# Patient Record
Sex: Male | Born: 1950 | Race: Black or African American | Hispanic: No | Marital: Married | Smoking: Former smoker
Health system: Southern US, Community
[De-identification: ages and names within clinical notes are randomized; demographics above are authoritative.]

## PROBLEM LIST (undated history)

## (undated) DIAGNOSIS — I2510 Atherosclerotic heart disease of native coronary artery without angina pectoris: Secondary | ICD-10-CM

## (undated) DIAGNOSIS — IMO0001 Reserved for inherently not codable concepts without codable children: Secondary | ICD-10-CM

## (undated) DIAGNOSIS — R0602 Shortness of breath: Secondary | ICD-10-CM

## (undated) DIAGNOSIS — E119 Type 2 diabetes mellitus without complications: Secondary | ICD-10-CM

## (undated) DIAGNOSIS — I10 Essential (primary) hypertension: Secondary | ICD-10-CM

## (undated) DIAGNOSIS — Z794 Long term (current) use of insulin: Secondary | ICD-10-CM

## (undated) HISTORY — DX: Atherosclerotic heart disease of native coronary artery without angina pectoris: I25.10

## (undated) HISTORY — DX: Shortness of breath: R06.02

## (undated) HISTORY — DX: Essential (primary) hypertension: I10

## (undated) HISTORY — DX: Type 2 diabetes mellitus without complications: E11.9

## (undated) HISTORY — DX: Long term (current) use of insulin: Z79.4

## (undated) HISTORY — DX: Reserved for inherently not codable concepts without codable children: IMO0001

---

## 2000-06-27 ENCOUNTER — Emergency Department (HOSPITAL_COMMUNITY): Admission: EM | Admit: 2000-06-27 | Discharge: 2000-06-27 | Payer: Self-pay | Admitting: Emergency Medicine

## 2005-01-29 ENCOUNTER — Emergency Department (HOSPITAL_COMMUNITY): Admission: EM | Admit: 2005-01-29 | Discharge: 2005-01-29 | Payer: Self-pay | Admitting: Emergency Medicine

## 2010-08-15 ENCOUNTER — Observation Stay (HOSPITAL_COMMUNITY)
Admission: EM | Admit: 2010-08-15 | Discharge: 2010-08-15 | Disposition: A | Discharge status: Home / Self Care | Payer: Medicare Other | Attending: Emergency Medicine | Admitting: Emergency Medicine

## 2010-08-15 ENCOUNTER — Emergency Department (HOSPITAL_COMMUNITY): Payer: Medicare Other

## 2010-08-15 ENCOUNTER — Observation Stay (HOSPITAL_COMMUNITY): Payer: Medicare Other

## 2010-08-15 DIAGNOSIS — R0602 Shortness of breath: Secondary | ICD-10-CM | POA: Insufficient documentation

## 2010-08-15 DIAGNOSIS — E119 Type 2 diabetes mellitus without complications: Secondary | ICD-10-CM | POA: Insufficient documentation

## 2010-08-15 DIAGNOSIS — Z794 Long term (current) use of insulin: Secondary | ICD-10-CM | POA: Insufficient documentation

## 2010-08-15 DIAGNOSIS — I10 Essential (primary) hypertension: Secondary | ICD-10-CM | POA: Insufficient documentation

## 2010-08-15 DIAGNOSIS — R079 Chest pain, unspecified: Principal | ICD-10-CM | POA: Insufficient documentation

## 2010-08-15 LAB — CBC
Hemoglobin: 12.2 g/dL — ABNORMAL LOW (ref 13.0–17.0)
MCH: 24.7 pg — ABNORMAL LOW (ref 26.0–34.0)
MCHC: 32.2 g/dL (ref 30.0–36.0)
RDW: 15 % (ref 11.5–15.5)

## 2010-08-15 LAB — BASIC METABOLIC PANEL
CO2: 23 mEq/L (ref 19–32)
Chloride: 102 mEq/L (ref 96–112)
Creatinine, Ser: 1.06 mg/dL (ref 0.4–1.5)
GFR calc Af Amer: >60 mL/min (ref >60)
Glucose, Bld: 205 mg/dL — ABNORMAL HIGH (ref 70–99)

## 2010-08-15 LAB — CK TOTAL AND CKMB (NOT AT ARMC)
CK, MB: 3.5 ng/mL (ref 0.3–4.0)
Relative Index: 1.3 (ref 0.0–2.5)

## 2010-08-15 LAB — POCT CARDIAC MARKERS: Myoglobin, poc: 56.8 ng/mL (ref 12–200)

## 2010-08-15 MED ORDER — IOHEXOL 350 MG/ML SOLN
80.0000 mL | Freq: Once | INTRAVENOUS | Status: AC | PRN
  Administered 2010-08-15: 80 mL via INTRAVENOUS

## 2010-08-23 ENCOUNTER — Encounter: Payer: Self-pay | Admitting: Cardiovascular Disease

## 2010-08-24 ENCOUNTER — Ambulatory Visit (INDEPENDENT_AMBULATORY_CARE_PROVIDER_SITE_OTHER): Payer: Medicare Other | Admitting: Cardiovascular Disease

## 2010-08-24 ENCOUNTER — Encounter: Payer: Self-pay | Admitting: *Deleted

## 2010-08-24 ENCOUNTER — Encounter: Payer: Self-pay | Admitting: Cardiovascular Disease

## 2010-08-24 VITALS — BP 140/80 | HR 75 | Ht 69.0 in | Wt 207.0 lb

## 2010-08-24 DIAGNOSIS — E119 Type 2 diabetes mellitus without complications: Secondary | ICD-10-CM | POA: Insufficient documentation

## 2010-08-24 DIAGNOSIS — E785 Hyperlipidemia, unspecified: Secondary | ICD-10-CM | POA: Insufficient documentation

## 2010-08-24 DIAGNOSIS — I208 Other forms of angina pectoris: Secondary | ICD-10-CM

## 2010-08-24 DIAGNOSIS — I10 Essential (primary) hypertension: Secondary | ICD-10-CM | POA: Insufficient documentation

## 2010-08-24 DIAGNOSIS — R079 Chest pain, unspecified: Secondary | ICD-10-CM | POA: Insufficient documentation

## 2010-08-24 DIAGNOSIS — I209 Angina pectoris, unspecified: Secondary | ICD-10-CM

## 2010-08-24 NOTE — Patient Instructions (Signed)
Your physician recommends that you continue on your current medications as directed. Please refer to the Current Medication list given to you today.  Your physician has requested that you have a cardiac catheterization. Cardiac catheterization is used to diagnose and/or treat various heart conditions. Doctors may recommend this procedure for a number of different reasons. The most common reason is to evaluate chest pain. Chest pain can be a symptom of coronary artery disease (CAD), and cardiac catheterization can show whether plaque is narrowing or blocking your heart's arteries. This procedure is also used to evaluate the valves, as well as measure the blood flow and oxygen levels in different parts of your heart. For further information please visit [email or web address] Please follow instruction sheet, as given. 08/25/10 WITH DR Excell Seltzer  Your physician recommends that you schedule a follow-up appointment in: AFTER CATH

## 2010-08-24 NOTE — Progress Notes (Signed)
60 yo seen in [place] 5/21 for SSCP.  Enzymes negative  ECG reviewed and had mild T wave changes inferiorly.  Had cardiac CT which was read by Dr Reche Dixon.  I reviewed the study on the [employer] 3[employer] portal.  High likelyhood of > 70% calcific lesion in mid LAD.  Patient has continued to have exertional SSCP since D/C.  Previously followed at [facility] but just established at [facility].  CRF;s include DM on insulin, HTN and elevated lipids.  Has not had heart cath before.  No previously documented CAD.  Long discussion with Mr Banet and his wife. With abnormal CT, DM and SSCP needs heart cath.  Risks including stroke discussed and willing to proceed.  Will hold metformin and give half his normal long acting insulin tonight.  Will need stat BS and PT/PTT on arrival to section C tomorrow.  Have set up for Dr Excell Seltzer to cath in inpatient lab tomorrow am.  ROS: Denies fever, malais, weight loss, blurry vision, decreased visual acuity, cough, sputum, SOB, hemoptysis, pleuritic pain, palpitaitons, heartburn, abdominal pain, melena, lower extremity edema, claudication, or rash.   General: Affect appropriate Healthy:  appears stated age HEENT: normal Neck supple with no adenopathy JVP normal no bruits no thyromegaly Lungs clear with no wheezing and good diaphragmatic motion Heart:  S1/S2 no murmur,rub, gallop or click PMI normal Abdomen: benighn, BS positve, no tenderness, no AAA no bruit.  No HSM or HJR Distal pulses intact with no bruits No edema Neuro non-focal Skin warm and dry No muscular weakness  Medications Current Outpatient Prescriptions  Medication Sig Dispense Refill  . amitriptyline (ELAVIL) 25 MG tablet Take 25 mg by mouth at bedtime.        Marland Kitchen amLODipine (NORVASC) 10 MG tablet Take 10 mg by mouth daily.        . cyanocobalamin 500 MCG tablet Take 500 mcg by mouth daily.        Marland Kitchen dicyclomine (BENTYL) 10 MG capsule Take 10 mg by mouth as directed.        . hydroxypropyl methylcellulose (ISOPTO  TEARS) 2.5 % ophthalmic solution 1 drop.        . insulin aspart (NOVOLOG) 100 UNIT/ML injection Inject into the skin as directed.        Marland Kitchen lisinopril (PRINIVIL,ZESTRIL) 40 MG tablet Take 40 mg by mouth daily.        . metFORMIN (GLUMETZA) 1000 MG (MOD) 24 hr tablet Take 1,000 mg by mouth 2 (two) times daily with a meal.       . omeprazole (PRILOSEC) 20 MG capsule Take 20 mg by mouth daily.        . pravastatin (PRAVACHOL) 20 MG tablet Take 20 mg by mouth daily.          Allergies Review of patient's allergies indicates no known allergies.  Family History: No family history on file. Family history of DM on mothers side  Social History: History   Social History  . Marital Status: Legally Separated    Spouse Name: N/A    Number of Children: N/A  . Years of Education: N/A   Occupational History  . Not on file.   Social History Main Topics  . Smoking status: Former Smoker    Quit date: 03/28/1983  . Smokeless tobacco: Not on file  . Alcohol Use: No  . Drug Use: No  . Sexually Active: Not on file   Other Topics Concern  . Not on file   Social  History Narrative  . No narrative on file  Retiered.  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  Denies smoking and ETOH Sedentary.    Electrocardiogram:  5/21 NSR inferior T wave changes  Assessment and Plan

## 2010-08-24 NOTE — Assessment & Plan Note (Signed)
Well controlled.  Continue current medications and low sodium Dash type diet.    

## 2010-08-24 NOTE — Assessment & Plan Note (Signed)
Hold metformin for cath and take half LA insulin night before

## 2010-08-24 NOTE — Assessment & Plan Note (Signed)
Cholesterol is at goal.  Continue current dose of statin and diet Rx.  No myalgias or side effects.  F/U  LFT's in 6 months. No results found for this basename: LDLCALC             

## 2010-08-24 NOTE — Assessment & Plan Note (Signed)
Likely angina.  Reviewed cardiac CT.  Cath in am with Dr Excell Seltzer

## 2010-08-25 ENCOUNTER — Ambulatory Visit (HOSPITAL_COMMUNITY)
Admission: RE | Admit: 2010-08-25 | Discharge: 2010-08-25 | Disposition: A | Discharge status: Home / Self Care | Payer: Medicare Other | Source: Ambulatory Visit | Attending: Cardiovascular Disease | Admitting: Cardiovascular Disease

## 2010-08-25 DIAGNOSIS — I10 Essential (primary) hypertension: Secondary | ICD-10-CM | POA: Insufficient documentation

## 2010-08-25 DIAGNOSIS — I2510 Atherosclerotic heart disease of native coronary artery without angina pectoris: Secondary | ICD-10-CM | POA: Insufficient documentation

## 2010-08-25 DIAGNOSIS — R079 Chest pain, unspecified: Secondary | ICD-10-CM | POA: Insufficient documentation

## 2010-08-25 DIAGNOSIS — E119 Type 2 diabetes mellitus without complications: Secondary | ICD-10-CM | POA: Insufficient documentation

## 2010-08-25 LAB — GLUCOSE, CAPILLARY: Glucose-Capillary: 149 mg/dL — ABNORMAL HIGH (ref 70–99)

## 2010-08-25 LAB — BASIC METABOLIC PANEL
CO2: 29 mEq/L (ref 19–32)
Chloride: 103 mEq/L (ref 96–112)
GFR calc Af Amer: >60 mL/min (ref >60)
Glucose, Bld: 163 mg/dL — ABNORMAL HIGH (ref 70–99)

## 2010-08-25 LAB — CBC
Platelets: 263 10*3/uL (ref 150–400)
RBC: 4.74 MIL/uL (ref 4.22–5.81)
RDW: 14.8 % (ref 11.5–15.5)
WBC: 5.9 10*3/uL (ref 4.0–10.5)

## 2010-08-25 LAB — APTT: aPTT: 29 seconds (ref 24–37)

## 2010-08-25 LAB — PROTIME-INR: INR: 1.04 (ref 0.00–1.49)

## 2010-08-25 LAB — POCT ACTIVATED CLOTTING TIME: Activated Clotting Time: 228 seconds

## 2010-09-19 ENCOUNTER — Telehealth: Payer: Self-pay | Admitting: Cardiovascular Disease

## 2010-09-19 NOTE — Telephone Encounter (Signed)
Pt dropped off [facility] papers to be completed sent to Victorio Palm 09/19/10/km

## 2010-09-22 ENCOUNTER — Telehealth: Payer: Self-pay | Admitting: Cardiovascular Disease

## 2010-09-22 NOTE — Telephone Encounter (Signed)
After receiving it from [facility]. I put the patients ROI and Disability Form into the courier to be forwarded to [facility] for completion.

## 2010-09-29 ENCOUNTER — Telehealth: Payer: Self-pay | Admitting: Cardiovascular Disease

## 2010-09-29 NOTE — Telephone Encounter (Signed)
[facility] Form faxed to [phone] Per Victorio Palm 09/29/10/km

## 2010-09-29 NOTE — Telephone Encounter (Signed)
Pt checking on the status of paperwork he brought in

## 2010-09-29 NOTE — Telephone Encounter (Signed)
Spoke with pt, he wants a copy of the form sent to the [place] mailed to him. Medical records locating the form for me to copy for the pt [name]

## 2010-09-30 ENCOUNTER — Telehealth: Payer: Self-pay | Admitting: Cardiovascular Disease

## 2010-09-30 NOTE — Telephone Encounter (Signed)
Pt walked into the office and questions answered [name]

## 2010-09-30 NOTE — Telephone Encounter (Signed)
[facility]  [facility] .

## 2010-09-30 NOTE — Telephone Encounter (Signed)
Per pt calling, receive his paperwork - has additional question.

## 2010-10-13 NOTE — Cardiovascular Report (Signed)
[name], [name]                ACCOUNT NO.:  [id number]  MEDICAL RECORD NO.:  [id number]           PATIENT TYPE:  O  LOCATION:  6522                         FACILITY:  [facility]  PHYSICIAN:  Veverly Fells. Excell Seltzer, MD  DATE OF BIRTH:  [birth year]  DATE OF PROCEDURE:  08/25/2010 DATE OF DISCHARGE:  08/25/2010                           CARDIAC CATHETERIZATION   PROCEDURES: 1. Left heart catheterization. 2. Selective coronary angiography. 3. Left ventricular angiography. 4. Pressure wire analysis of the left anterior descending.  SURGEON:  Veverly Fells. Excell Seltzer, MD  [name] INDICATIONS:  [name] is a 60 year old diabetic, hypertensive gentleman with chest pain.  He was evaluated in the emergency department and had an abnormal CT coronary angiogram demonstrating 70% mid LAD stenosis with calcification.  The patient was referred for cardiac catheterization in the setting of angina and an abnormal CT coronary angiogram.  DESCRIPTION OF PROCEDURE:  Risks and indications of procedure were reviewed with the patient.  Informed consent was obtained.  The right wrist was prepped, draped, and anesthetized with 1% lidocaine using modified Seldinger technique.  A 5-French sheath was placed in the right radial artery, 4000 units of fractionated heparin was administered intravenously, 3 mg of verapamil was given through the sheath.  Standard Judkins catheters were used for coronary angiography and left ventriculography.  Following the diagnostic procedure, I elected to perform pressure wire analysis of the mid-LAD lesion.  PROCEDURAL FINDINGS:  Aortic pressure 139/75 with a mean of 102.  Left ventricular pressure 141/29.  Left ventriculography shows vigorous LV function.  The left ventricular ejection fraction is 65-70%.  Left mainstem:  Angiographically normal.  The left main divides into the LAD and left circumflex.  LAD:  The LAD is of large caliber.  It reaches the left  ventricular apex.  The LAD gives off a large first diagonal branch.  At the origin of first diagonal branch in the mid LAD is a 50-60% stenosis.  This is the lesion that was interrogated with pressure wire analysis.  The pressure wire demonstrated an FFR equal to 0.85 at peak hyperemia with intravenous adenosine.  Left circumflex:  The left circumflex is widely patent.  It is a large- caliber vessel supplying a tiny first OM, moderate second OM, and a large third OM.  There is no significant obstructive disease through the left circumflex distribution.  Right coronary artery:  The RCA is a large, dominant vessel.  It supplies a PDA and two posterolateral branches.  There is no significant obstructive disease throughout the distribution of the right coronary artery.  PRESSURE WIRE DATA:  The radial sheath was upsized to a 6-French over an 0.035 wire.  A 6-French XB LAD guide catheter was used for the procedure.  An additional 4000 units of unfractionated heparin was administered and once the ACT was greater than 200, the pressure wire was normalized at the guide tip and then advanced beyond the lesion in the mid-LAD.  With peak hyperemia after 2-3 minutes of adenosine, the FFR was equal to 0.85 and two recordings were performed.  This is below the threshold for significant ischemia and the wire  was pulled out. Final angiography demonstrated stable appearance of the lesion.  The patient will be treated medically.  A TR band was used for radial hemostasis.  FINAL CONCLUSIONS: 1. Nonobstructive coronary artery disease as outlined above with the     most significant lesion being a 50% stenosis in the mid-left     anterior descending. 2. Negative pressure wire analysis of the left anterior descending     with an FFR of 0.85. 3. Normal left ventricular systolic function. 4. Elevated left ventricular end-diastolic pressure consistent with     diastolic dysfunction.  RECOMMENDATIONS:   Aggressive medical therapy.     Veverly Fells. Excell Seltzer, MD     MDC/MEDQ  D:  08/25/2010  T:  08/25/2010  Job:  [id number]  cc:   Noralyn Pick. Eden Emms, MD, [facility]  Electronically Signed by Tonny Bollman MD on 10/13/2010 12:08:05 AM

## 2010-12-12 ENCOUNTER — Telehealth: Payer: Self-pay | Admitting: Cardiovascular Disease

## 2010-12-12 NOTE — Telephone Encounter (Signed)
This pt was seen in the office by Dr Eden Emms and then had a cardiac cath performed by Dr Excell Seltzer in May.  This pt has not had any follow-up appointments. I left a message for the pt to call the office back to discuss need for clearance.

## 2010-12-12 NOTE — Telephone Encounter (Signed)
Pt returning your call msh °

## 2010-12-12 NOTE — Telephone Encounter (Signed)
I spoke with the pt and he was seen in [facility] by [facility].  The pt needs an implant for erectile dysfuction.  The pt's procedure is  not scheduled at this time. The pt has not been seen in our office since his procedure in May.  I made the pt aware that he will need an appointment to discuss surgical clearance.

## 2010-12-12 NOTE — Telephone Encounter (Signed)
Please call about surgical clearance

## 2010-12-13 NOTE — Telephone Encounter (Signed)
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  I scheduled him to see Dr Eden Emms on 12/29/10 to discuss surgical clearance.  The pt will contact Urology office to have records faxed for his up coming appointment. Fax number given to patient.

## 2010-12-29 ENCOUNTER — Encounter: Payer: Self-pay | Admitting: Cardiovascular Disease

## 2010-12-29 ENCOUNTER — Telehealth: Payer: Self-pay | Admitting: Cardiovascular Disease

## 2010-12-29 ENCOUNTER — Ambulatory Visit (INDEPENDENT_AMBULATORY_CARE_PROVIDER_SITE_OTHER): Payer: Medicare Other | Admitting: Cardiovascular Disease

## 2010-12-29 DIAGNOSIS — E119 Type 2 diabetes mellitus without complications: Secondary | ICD-10-CM

## 2010-12-29 DIAGNOSIS — I2510 Atherosclerotic heart disease of native coronary artery without angina pectoris: Secondary | ICD-10-CM

## 2010-12-29 DIAGNOSIS — Z01810 Encounter for preprocedural cardiovascular examination: Secondary | ICD-10-CM

## 2010-12-29 DIAGNOSIS — I10 Essential (primary) hypertension: Secondary | ICD-10-CM

## 2010-12-29 MED ORDER — ISOSORBIDE MONONITRATE 30 MG PO TB24: 15.0000 mg | ORAL_TABLET | Freq: Every day | ORAL | Status: DC

## 2010-12-29 MED ORDER — METOPROLOL SUCCINATE ER 50 MG PO TB24: 50.0000 mg | ORAL_TABLET | Freq: Every day | ORAL | Status: DC

## 2010-12-29 NOTE — Assessment & Plan Note (Signed)
No angina.  FFR normal at cath.  Will D/C calcium blocker and start nitrates and beta blocker.  Ok to proceed with surgery for ED.  No viagra or cialis with CAD and starting nitrates.  Discussed this with patient

## 2010-12-29 NOTE — Telephone Encounter (Signed)
Dr Logan Bores is his Urologist in [place] [phone], was to call and let dr Eden Emms know

## 2010-12-29 NOTE — Progress Notes (Signed)
Addended by: Scherrie Bateman E on: 12/29/2010 10:02 AM   Modules accepted: Orders

## 2010-12-29 NOTE — Assessment & Plan Note (Signed)
Target A1c 6.5 or less.  Discussed low carb diet 

## 2010-12-29 NOTE — Assessment & Plan Note (Signed)
Well controlled.  Continue current medications and low sodium Dash type diet.    

## 2010-12-29 NOTE — Patient Instructions (Signed)
Your physician wants you to follow-up in: 3 MONTHS WITH DR Haywood Filler will receive a reminder letter in the mail two months in advance. If you don't receive a letter, please call our office to schedule the follow-up appointment. Your physician has recommended you make the following change in your medication: STOP AMLODIPINE  AND START IMDUR 15 MG 1 EVERY DAY  AND TOPROL 50 MG 1 EVERY DAY

## 2010-12-29 NOTE — Telephone Encounter (Signed)
Will forward to Dr.Nishan. 

## 2010-12-29 NOTE — Assessment & Plan Note (Signed)
Stable with no angina and good activity level.  Continue medical Rx  

## 2010-12-29 NOTE — Progress Notes (Signed)
60 yo seen in [place] 5/21 for SSCP. Enzymes negative ECG reviewed and had mild T wave changes inferiorly. Had cardiac CT which was read by Dr Reche Dixon. I reviewed the study on the [employer] 3[employer] portal. High likelyhood of > 70% calcific lesion in mid LAD. Patient has continued to have exertional SSCP since D/C. Previously followed at [facility] but just established at [facility]. CRF;s include DM on insulin, HTN and elevated lipids.  Cath by Dr Excell Seltzer in May wit 60% LAD and negative flow wire.  Medical Rx recommended.  No angina.  ECG today shows more prominent inferolateral T wave changes than 08/25/10.  Has ED with surgery pending.  Does not remember urologist name.  Failed shots and cialis. Told him not to take viagra or cialis anymore.    Low risk surgery with recent cath showing no flow limitation to LAD and no significant circ or RCA disease.  Will adjust meds and clear for surgery.  D/C amlodipine and add nitrates and beta blocker.    ROS: Denies fever, malais, weight loss, blurry vision, decreased visual acuity, cough, sputum, SOB, hemoptysis, pleuritic pain, palpitaitons, heartburn, abdominal pain, melena, lower extremity edema, claudication, or rash.  All other systems reviewed and negative  General: Affect appropriate Healthy:  appears stated age HEENT: normal Neck supple with no adenopathy JVP normal no bruits no thyromegaly Lungs clear with no wheezing and good diaphragmatic motion Heart:  S1/S2 no murmur,rub, gallop or click PMI normal Abdomen: benighn, BS positve, no tenderness, no AAA no bruit.  No HSM or HJR Distal pulses intact with no bruits No edema Neuro non-focal Skin warm and dry No muscular weakness   Current Outpatient Prescriptions  Medication Sig Dispense Refill  . amitriptyline (ELAVIL) 25 MG tablet Take 25 mg by mouth at bedtime.        Marland Kitchen amLODipine (NORVASC) 10 MG tablet Take 10 mg by mouth daily.        . cyanocobalamin 500 MCG tablet Take 500 mcg by mouth daily.         Marland Kitchen dicyclomine (BENTYL) 10 MG capsule Take 10 mg by mouth as directed.        . hydroxypropyl methylcellulose (ISOPTO TEARS) 2.5 % ophthalmic solution 1 drop.        . insulin aspart (NOVOLOG) 100 UNIT/ML injection Inject into the skin as directed.        Marland Kitchen lisinopril (PRINIVIL,ZESTRIL) 40 MG tablet Take 40 mg by mouth daily.        . metFORMIN (GLUMETZA) 1000 MG (MOD) 24 hr tablet Take 1,000 mg by mouth 2 (two) times daily with a meal.       . omeprazole (PRILOSEC) 20 MG capsule Take 20 mg by mouth daily.        . pravastatin (PRAVACHOL) 20 MG tablet Take 20 mg by mouth daily.          Allergies  Review of patient's allergies indicates no known allergies.  Electrocardiogram:  Assessment and Plan

## 2011-01-09 ENCOUNTER — Telehealth: Payer: Self-pay | Admitting: Cardiovascular Disease

## 2011-01-09 NOTE — Telephone Encounter (Signed)
Spoke with pt, clearance note faxed to dr Logan Bores at [phone]. [name]

## 2011-01-09 NOTE — Telephone Encounter (Signed)
Pt was calling about surgical clearance please call

## 2011-01-19 ENCOUNTER — Telehealth: Payer: Self-pay | Admitting: Cardiovascular Disease

## 2011-01-19 NOTE — Telephone Encounter (Signed)
Add Toprol 50mg /day

## 2011-01-19 NOTE — Telephone Encounter (Signed)
10/24--pt calling stating BP meds not working--BP per pt last 2 days=174/74, 186/80---when i called pt i had him sit down at home take some slow easy breaths and then take BP--it was 140/68--advised pt dr Eden Emms and debra not in office today, but i would let them know your BP readings and let them know you are concerned--pt agrees--nt

## 2011-01-19 NOTE — Telephone Encounter (Signed)
Pt says his BP is still high.  He says the med his is taking is not working. He wants to talk to you

## 2011-01-20 NOTE — Telephone Encounter (Addendum)
Spoke with pt, he is currently taking toprol 50 mg. Will forward for dr Eden Emms review  [name]

## 2011-01-22 NOTE — Telephone Encounter (Signed)
If BP running high continue Toprol 50 and Imdur 30mg   D/C Lisinopril and start Hyzaar 100/25.  Check BMET in 4 weeks if change made

## 2011-01-23 NOTE — Telephone Encounter (Signed)
Spoke with pt, he states he is already taking a fluid pill. Explained to pt we can not adjust his medicine without having a current med list. He will call back when he is at home and can tell me his medicine [name]

## 2011-02-02 ENCOUNTER — Telehealth: Payer: Self-pay | Admitting: Cardiovascular Disease

## 2011-02-02 NOTE — Telephone Encounter (Signed)
Pt returning your call about his meds

## 2011-02-02 NOTE — Telephone Encounter (Signed)
Spoke with pt wife, he is currently in [place] having an outpt procedure. Explained we need to know his meds to adjust them for his bp. She will have him call me [name]

## 2011-02-03 NOTE — Telephone Encounter (Signed)
Spoke with pt, had been trying to get the correct med list from the pt so adjustments could be made in regards to his bp. He states his bp has been taken care of [name]

## 2012-06-22 IMAGING — CT CT HEART MORP W/ CTA COR W/ SCORE W/ CA W/CM &/OR W/O CM
1 of 7 series · 11 of 20 positions shown, 14 images · IV contrast (CONTRAST)
Comparison: None.

INDICATION: Left-sided chest pain.  Shortness of breath.

CT ANGIOGRAPHY OF THE HEART, CORONARY ARTERY, STRUCTURE, AND
MORPHOLOGY 08/15/2010:
CONTRAST:  80 ml Omnipaque 350 IV.
TECHNIQUE: CT angiography of the coronary vessels was performed on
a 256 channel system using prospective ECG gating.  A scout and
noncontrast exam (for calcium scoring) were performed.  Circulation
time was measured using a test bolus.  Coronary CTA was performed
with sub mm slice collimation during portions of the cardiac cycle
(73%, 78%, and 83%) during injection of iodinated contrast.
Imaging post processing was performed on an independent workstation
creating multiplanar and 3-D images, and quantitative analysis of
the heart and coronary arteries.  Note that this exam targets the
heart and the chest was not imaged in its entirety.
PREMEDICATION:
Lopressor 50 mg, P.O.
Lopressor 0 mg, IV
Nitroglycerin 0.4 mcg, sublingual.

[Series 7: w/ edge cor., 78.0% · axial · 0.49mm/px · z∈[+676,+792]mm · 11 of 311 slices shown, 14 images]
[im 26/311  vessel]
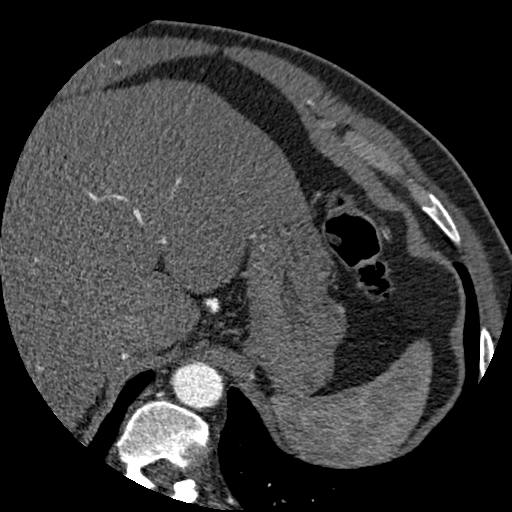
[im 26/311  lung]
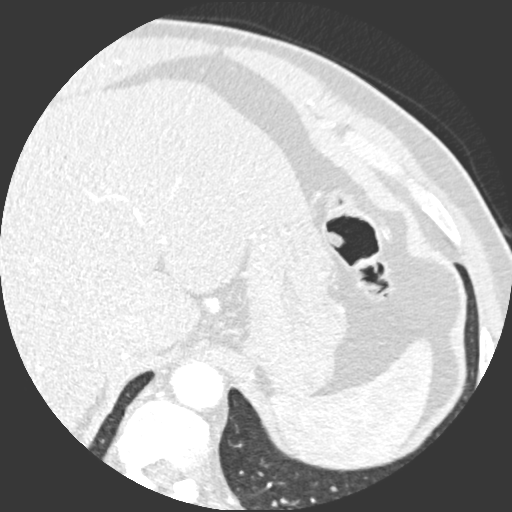
[im 52/311  vessel]
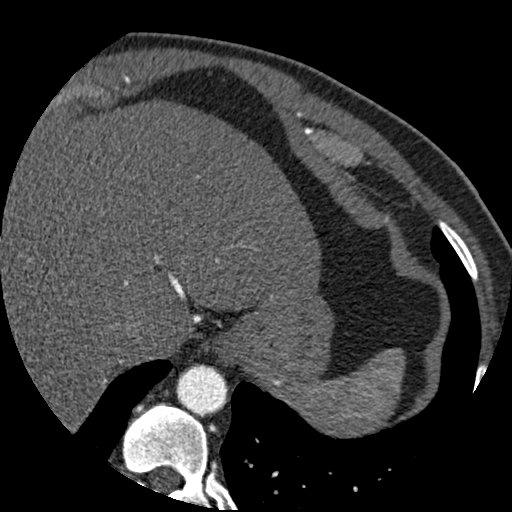
[im 78/311  vessel]
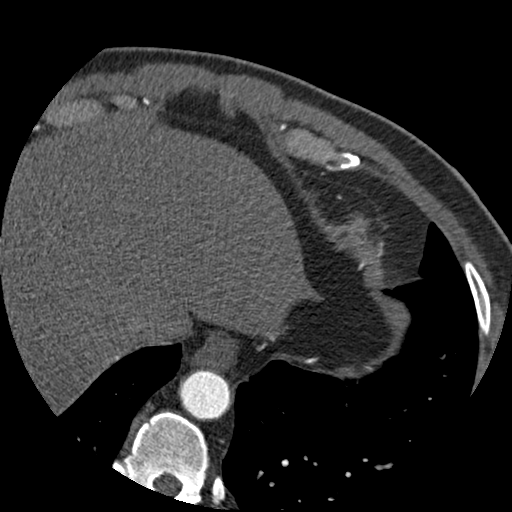
[im 104/311  vessel]
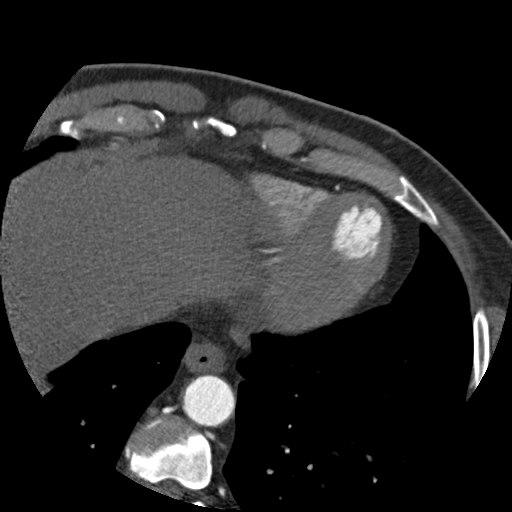
[im 130/311  vessel]
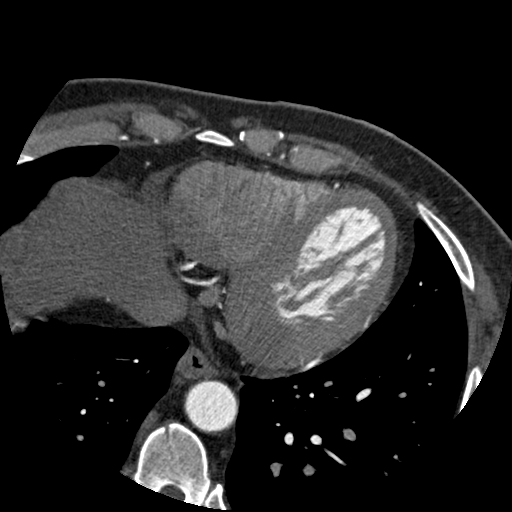
[im 130/311  lung]
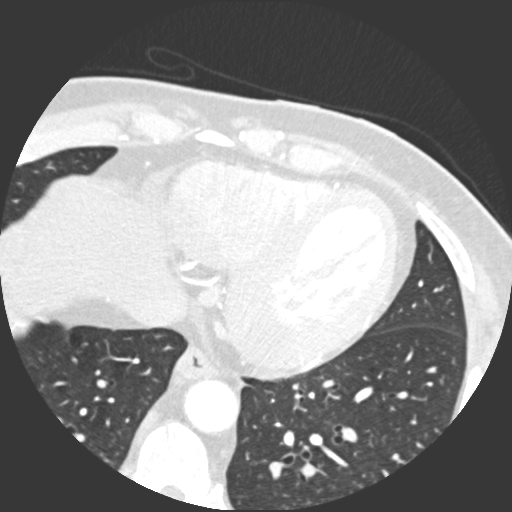
[im 156/311  vessel]
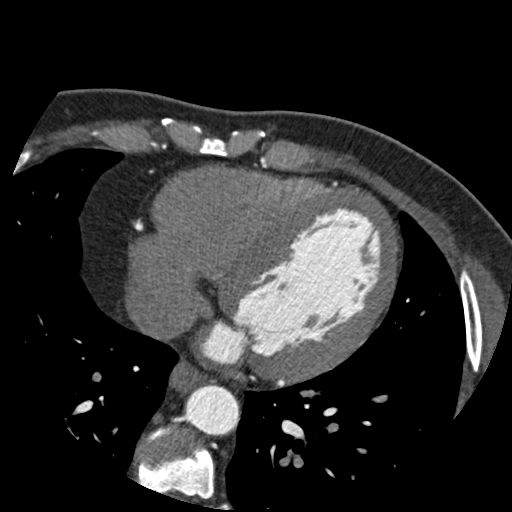
[im 181/311  vessel]
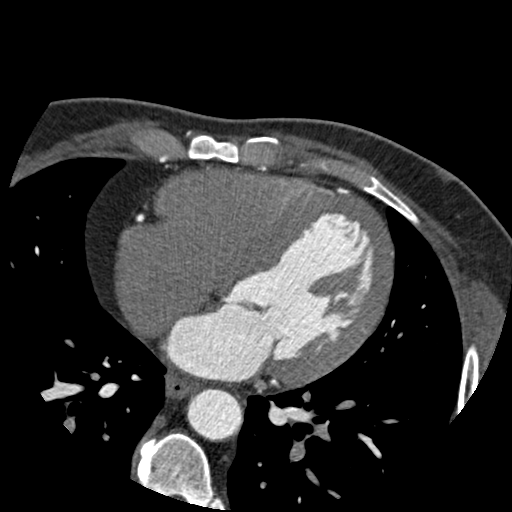
[im 207/311  vessel]
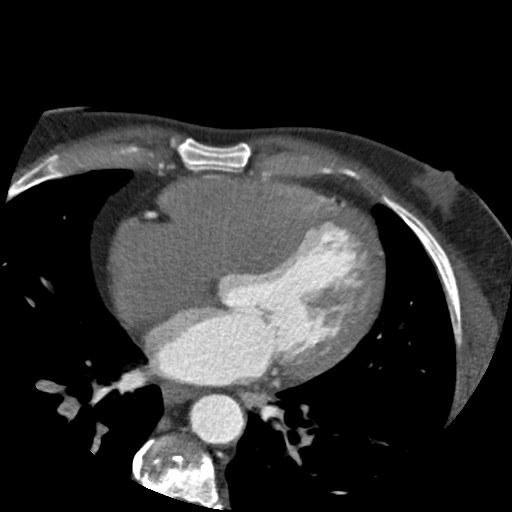
[im 233/311  vessel]
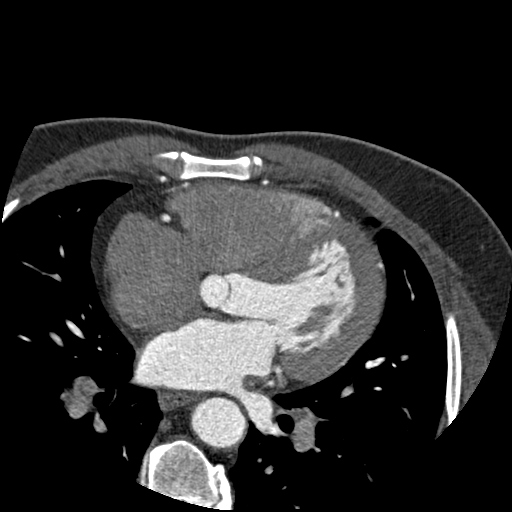
[im 233/311  lung]
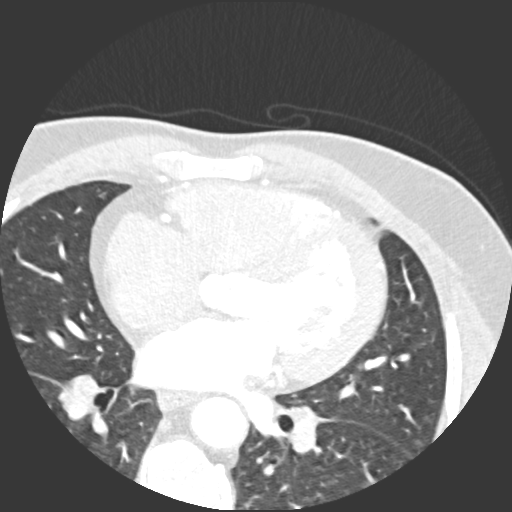
[im 259/311  vessel]
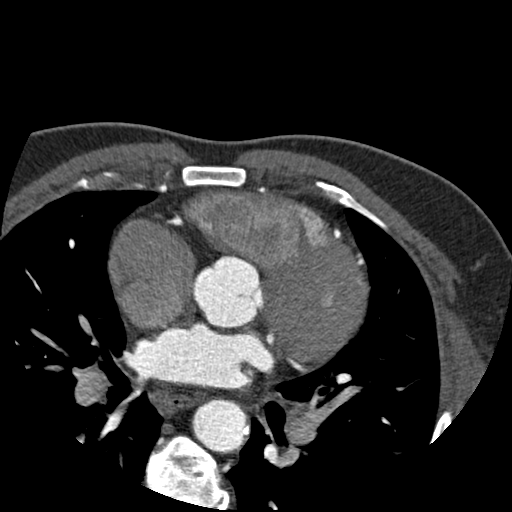
[im 285/311  vessel]
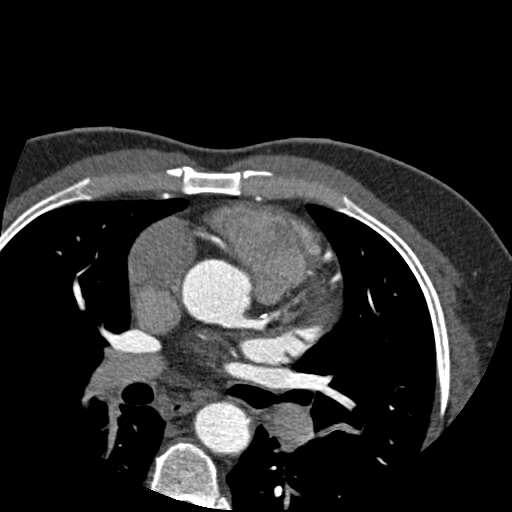

[11 of 20 positions shown; findings below may reference images not displayed]

FINDINGS: Technical quality:  Excellent.
Heart rate:  55

CORONARY ARTERIES:
Left main coronary artery:  Nonobstructive mixed calcified and
noncalcified plaque at the origin of the left main.

Left anterior descending:  Diminutive first and third diagonals
with large second diagonal.  Nonobstructive mixed calcified and
noncalcified (predominately calcified) plaque just distal to the
LAD origin.  Nonobstructive noncalcified plaque in the proximal
LAD.  Large calcified plaque in the mid LAD, just distal to the
second diagonal origin, which it is possibly obstructive (near 50%
diameter).  A non obstructive mixed calcified and noncalcified
plaque in the mid LAD, just distal to the large plaque.

Left circumflex:  2 large obtuse marginal branches.  Nonobstructing
minimal calcified plaque in the circumflex just distal to the first
diagonal origin.

Right coronary artery:  Minimal nonobstructive calcified plaque in
the proximal RCA.

Posterior descending artery:  Normal.

Dominance:  Right.

CORONARY CALCIUM:

Total Agatston Score:  192
[HOSPITAL] percentile:  71st

CARDIAC MEASUREMENTS:
Interventricular septum (6 - 12 mm):
LV posterior wall (6 - 12 mm):
LV diameter in diastole (35 - 52 mm):

AORTA AND PULMONARY MEASUREMENTS:
Aortic root (21 - 40 mm):
            22 mm  at the annulus
            35 mm  at the sinuses of Valsalva
            26 mm  at the sinotubular junction
Ascending aorta (<  40 mm):  32 mm
Descending aorta (<  40 mm):  28 mm
Main pulmonary artery:  (<  30 mm):  28 mm

EXTRACARDIAC FINDINGS:
No significant lymphadenopathy within the visualized mediastinum.
Mild to moderate mixed calcified and noncalcified plaque within the
visualized thoracic aorta, without evidence of aneurysm.  Calcified
granulomata within the visualized spleen.  Visualized upper abdomen
otherwise unremarkable.  Approximate 5 mm peripheral nodule in the
right lower lobe posterolaterally (series 3, image 8).  No other
pulmonary nodules.  Lungs otherwise clear.  No pleural effusions.
IMPRESSION: 1.  Moderate coronary artery disease.  The patient's total coronary
artery calcium score is 192, which is 71st percentile for patient's
matched age and gender.
2.  Possibly obstructive large calcified plaque in the mid left
anterior descending coronary artery, just distal to the origin of
the large second diagonal branch.  Please see above for details of
nonobstructive plaques elsewhere.
3.  Right coronary artery dominance.
4.  Approximate 5 mm peripheral nodule in the right lower lobe
posterolaterally.  Please see below for followup recommendations.

Report was called to [REDACTED] mid level, Kris, at [phone] at 7898
hours on 08/15/2010.

If the patient is at high risk for bronchogenic carcinoma, follow-
up chest CT at 6-12 months is recommended.  If the patient is at
low risk for bronchogenic carcinoma, follow-up chest CT at 12
months is recommended.  This recommendation follows the consensus
statement: Guidelines for Management of Small Pulmonary Nodules
Detected on CT Scans: A Statement from the [HOSPITAL] as
[URL]

## 2012-07-19 ENCOUNTER — Encounter: Payer: Self-pay | Admitting: *Deleted

## 2012-07-19 ENCOUNTER — Encounter: Payer: Self-pay | Admitting: Cardiovascular Disease

## 2012-07-22 ENCOUNTER — Encounter: Payer: Self-pay | Admitting: Cardiovascular Disease

## 2012-07-22 ENCOUNTER — Ambulatory Visit (INDEPENDENT_AMBULATORY_CARE_PROVIDER_SITE_OTHER): Payer: Medicare Other | Admitting: Cardiovascular Disease

## 2012-07-22 VITALS — BP 156/72 | HR 76 | Ht 69.0 in | Wt 219.4 lb

## 2012-07-22 DIAGNOSIS — E119 Type 2 diabetes mellitus without complications: Secondary | ICD-10-CM

## 2012-07-22 DIAGNOSIS — I10 Essential (primary) hypertension: Secondary | ICD-10-CM

## 2012-07-22 DIAGNOSIS — I2510 Atherosclerotic heart disease of native coronary artery without angina pectoris: Secondary | ICD-10-CM

## 2012-07-22 DIAGNOSIS — E785 Hyperlipidemia, unspecified: Secondary | ICD-10-CM

## 2012-07-22 NOTE — Assessment & Plan Note (Signed)
Well controlled.  Continue current medications and low sodium Dash type diet.    

## 2012-07-22 NOTE — Progress Notes (Signed)
Patient ID: [name], male   DOB: September 21, 1950, 62 y.o.   MRN: [id number] 62 yo seen in [place] 08/15/10 for SSCP. Enzymes negative ECG reviewed and had mild T wave changes inferiorly. Had cardiac CT which was read by Dr Reche Dixon. I reviewed the study on the [employer] 3[employer] portal. High likelyhood of > 70% calcific lesion in mid LAD. Patient has continued to have exertional SSCP since D/C. Previously followed at [facility] but just established at [facility]. CRF;s include DM on insulin, HTN and elevated lipids. Cath by Dr Excell Seltzer in May 2012  wit 60% LAD and negative flow wire. Medical Rx recommended.  Since last being seen no chest pain.  Seeing Dr Irene Limbo off [facility] Compliant with meds Needs to see eye doctor  ROS: Denies fever, malais, weight loss, blurry vision, decreased visual acuity, cough, sputum, SOB, hemoptysis, pleuritic pain, palpitaitons, heartburn, abdominal pain, melena, lower extremity edema, claudication, or rash.  All other systems reviewed and negative  General: Affect appropriate Obese black male HEENT: normal Neck supple with no adenopathy JVP normal no bruits no thyromegaly Lungs clear with no wheezing and good diaphragmatic motion Heart:  S1/S2 no murmur, no rub, gallop or click PMI normal Abdomen: benighn, BS positve, no tenderness, no AAA Ventral hernia no bruit.  No HSM or HJR Distal pulses intact with no bruits No edema Neuro non-focal Skin warm and dry No muscular weakness   Current Outpatient Prescriptions  Medication Sig Dispense Refill  . amitriptyline (ELAVIL) 25 MG tablet Take 25 mg by mouth at bedtime.        . cloNIDine (CATAPRES) 0.2 MG tablet Take 0.2 mg by mouth 3 (three) times daily.      . cyanocobalamin 500 MCG tablet Take 500 mcg by mouth daily.        Marland Kitchen dicyclomine (BENTYL) 10 MG capsule Take 10 mg by mouth as directed.        . furosemide (LASIX) 40 MG tablet Take 40 mg by mouth daily.      Marland Kitchen gabapentin (NEURONTIN) 300 MG capsule Take 300 mg by mouth 3 (three)  times daily.      . hydrALAZINE (APRESOLINE) 100 MG tablet Take 100 mg by mouth 3 (three) times daily.      . hydroxypropyl methylcellulose (ISOPTO TEARS) 2.5 % ophthalmic solution 1 drop.        Marland Kitchen ibuprofen (ADVIL,MOTRIN) 400 MG tablet Take 400 mg by mouth every 6 (six) hours as needed for pain.      Marland Kitchen insulin aspart (NOVOLOG) 100 UNIT/ML injection Inject into the skin as directed.        . isosorbide mononitrate (IMDUR) 30 MG CR tablet Take 0.5 tablets (15 mg total) by mouth daily.  30 tablet  6  . lisinopril (PRINIVIL,ZESTRIL) 40 MG tablet Take 40 mg by mouth daily.        . metFORMIN (GLUMETZA) 1000 MG (MOD) 24 hr tablet Take 1,000 mg by mouth 2 (two) times daily with a meal.       . metoprolol (LOPRESSOR) 50 MG tablet Take 50 mg by mouth 2 (two) times daily.      Marland Kitchen omeprazole (PRILOSEC) 20 MG capsule Take 20 mg by mouth daily.        . pravastatin (PRAVACHOL) 20 MG tablet Take 20 mg by mouth daily.        . sertraline (ZOLOFT) 100 MG tablet Take 100 mg by mouth daily.       No current facility-administered medications  for this visit.    Allergies  Review of patient's allergies indicates no known allergies.  Electrocardiogram:  SR rate 76 LVH lateral T wave inversion no change from 2012  Assessment and Plan

## 2012-07-22 NOTE — Assessment & Plan Note (Signed)
Discussed low carb diet.  Target hemoglobin A1c is 6.5 or less.  Continue current medications. Encouraged him to see eye doctor soon

## 2012-07-22 NOTE — Assessment & Plan Note (Signed)
Labs at [facility] Cholesterol is at goal.  Continue current dose of statin and diet Rx.  No myalgias or side effects.  F/U  LFT's in 6 months. No results found for this basename: LDLCALC

## 2012-07-22 NOTE — Patient Instructions (Signed)
Your physician wants you to follow-up in:  6 MONTHS WITH DR NISHAN  You will receive a reminder letter in the mail two months in advance. If you don't receive a letter, please call our office to schedule the follow-up appointment. Your physician recommends that you continue on your current medications as directed. Please refer to the Current Medication list given to you today. 

## 2012-07-22 NOTE — Assessment & Plan Note (Signed)
Stable with no angina and good activity level.  Continue medical Rx  

## 2012-07-23 ENCOUNTER — Encounter: Payer: Self-pay | Admitting: Cardiovascular Disease

## 2013-01-23 ENCOUNTER — Ambulatory Visit (INDEPENDENT_AMBULATORY_CARE_PROVIDER_SITE_OTHER): Payer: Medicare Other | Admitting: Cardiovascular Disease

## 2013-01-23 ENCOUNTER — Encounter: Payer: Self-pay | Admitting: Cardiovascular Disease

## 2013-01-23 ENCOUNTER — Encounter (INDEPENDENT_AMBULATORY_CARE_PROVIDER_SITE_OTHER): Payer: Self-pay

## 2013-01-23 VITALS — BP 168/70 | HR 72 | Ht 69.0 in | Wt 224.0 lb

## 2013-01-23 DIAGNOSIS — I2510 Atherosclerotic heart disease of native coronary artery without angina pectoris: Secondary | ICD-10-CM

## 2013-01-23 DIAGNOSIS — E785 Hyperlipidemia, unspecified: Secondary | ICD-10-CM

## 2013-01-23 DIAGNOSIS — E119 Type 2 diabetes mellitus without complications: Secondary | ICD-10-CM

## 2013-01-23 DIAGNOSIS — I10 Essential (primary) hypertension: Secondary | ICD-10-CM

## 2013-01-23 NOTE — Progress Notes (Signed)
Patient ID: [name], male   DOB: 14-Aug-1950, 62 y.o.   MRN: [id number] 62 yo seen in [place] 08/15/10 for SSCP. Enzymes negative ECG reviewed and had mild T wave changes inferiorly. Had cardiac CT which was read by Dr Reche Dixon. I reviewed the study on the [employer] portal. High likelyhood of > 70% calcific lesion in mid LAD. Patient has continued to have exertional SSCP since D/C. Previously followed at [facility] but just established at [facility]. CRF;s include DM on insulin, HTN and elevated lipids. Cath by Dr Excell Seltzer in May 2012 wit 60% LAD and negative flow wire. Medical Rx recommended. Since last being seen no chest pain. Seeing Dr Irene Limbo off [facility] Compliant with meds    Has home health helping check his BS.  Has seen eye doctor and no retinopathy.  [facility] and [facility] Walking with no angina   ROS: Denies fever, malais, weight loss, blurry vision, decreased visual acuity, cough, sputum, SOB, hemoptysis, pleuritic pain, palpitaitons, heartburn, abdominal pain, melena, lower extremity edema, claudication, or rash.  All other systems reviewed and negative  General: Affect appropriate Overweight black male  HEENT: normal Neck supple with no adenopathy JVP normal no bruits no thyromegaly Lungs clear with no wheezing and good diaphragmatic motion Heart:  S1/S2 soft SEM  murmur, no rub, gallop or click PMI normal Abdomen: benighn, BS positve, no tenderness, no AAA no bruit.  No HSM or HJR Distal pulses intact with no bruits Trace right LE  edema Neuro non-focal Skin warm and dry No muscular weakness   Current Outpatient Prescriptions  Medication Sig Dispense Refill  . amitriptyline (ELAVIL) 25 MG tablet Take 25 mg by mouth at bedtime.        . cloNIDine (CATAPRES) 0.2 MG tablet Take 0.2 mg by mouth 3 (three) times daily.      . cyanocobalamin 500 MCG tablet Take 500 mcg by mouth daily.        Marland Kitchen dicyclomine (BENTYL) 10 MG capsule Take 10 mg by mouth as  directed.        . furosemide (LASIX) 40 MG tablet Take 40 mg by mouth daily.      Marland Kitchen gabapentin (NEURONTIN) 300 MG capsule Take 300 mg by mouth 3 (three) times daily.      . hydrALAZINE (APRESOLINE) 100 MG tablet Take 100 mg by mouth 3 (three) times daily.      . hydroxypropyl methylcellulose (ISOPTO TEARS) 2.5 % ophthalmic solution 1 drop.        Marland Kitchen ibuprofen (ADVIL,MOTRIN) 400 MG tablet Take 400 mg by mouth every 6 (six) hours as needed for pain.      Marland Kitchen insulin aspart (NOVOLOG) 100 UNIT/ML injection Inject into the skin as directed.        . isosorbide mononitrate (IMDUR) 30 MG CR tablet Take 0.5 tablets (15 mg total) by mouth daily.  30 tablet  6  . lisinopril (PRINIVIL,ZESTRIL) 40 MG tablet Take 40 mg by mouth daily.        . metFORMIN (GLUMETZA) 1000 MG (MOD) 24 hr tablet Take 1,000 mg by mouth 2 (two) times daily with a meal.       . metoprolol (LOPRESSOR) 50 MG tablet Take 50 mg by mouth 2 (two) times daily.      Marland Kitchen omeprazole (PRILOSEC) 20 MG capsule Take 20 mg by mouth daily.        . pravastatin (PRAVACHOL) 20 MG tablet Take 20 mg by mouth daily.        Marland Kitchen  sertraline (ZOLOFT) 100 MG tablet Take 100 mg by mouth daily.       No current facility-administered medications for this visit.    Allergies  Review of patient's allergies indicates no known allergies.  Electrocardiogram: 07/22/12 SR rate 76 minor lateral T wave changes   Assessment and Plan

## 2013-01-23 NOTE — Assessment & Plan Note (Signed)
Stable with no angina and good activity level.  Continue medical Rx Has nitro and not needed to use it

## 2013-01-23 NOTE — Assessment & Plan Note (Signed)
Still somewhat hard to control.  [facility] by [facility].  Discussed weight loss and salt intake

## 2013-01-23 NOTE — Assessment & Plan Note (Signed)
Discussed low carb diet.  Target hemoglobin A1c is 6.5 or less.  Continue current medications.  

## 2013-01-23 NOTE — Patient Instructions (Signed)
Your physician wants you to follow-up in:   6 MONTHS WITH  DR NSIHAN  You will receive a reminder letter in the mail two months in advance. If you don't receive a letter, please call our office to schedule the follow-up appointment. Your physician recommends that you continue on your current medications as directed. Please refer to the Current Medication list given to you today.  

## 2013-01-23 NOTE — Assessment & Plan Note (Signed)
Cholesterol is at goal.  Continue current dose of statin and diet Rx.  No myalgias or side effects.  F/U  LFT's in 6 months. No results found for this basename: LDLCALC  [facility] with [facility]

## 2013-08-01 ENCOUNTER — Ambulatory Visit: Payer: Medicare Other | Admitting: Cardiovascular Disease

## 2013-08-29 ENCOUNTER — Ambulatory Visit (INDEPENDENT_AMBULATORY_CARE_PROVIDER_SITE_OTHER): Payer: Medicare Other | Admitting: Cardiovascular Disease

## 2013-08-29 ENCOUNTER — Encounter: Payer: Self-pay | Admitting: Cardiovascular Disease

## 2013-08-29 VITALS — BP 126/58 | HR 63 | Ht 69.0 in | Wt 228.8 lb

## 2013-08-29 DIAGNOSIS — I10 Essential (primary) hypertension: Secondary | ICD-10-CM

## 2013-08-29 DIAGNOSIS — R609 Edema, unspecified: Secondary | ICD-10-CM | POA: Insufficient documentation

## 2013-08-29 DIAGNOSIS — I2510 Atherosclerotic heart disease of native coronary artery without angina pectoris: Secondary | ICD-10-CM | POA: Diagnosis not present

## 2013-08-29 MED ORDER — FUROSEMIDE 40 MG PO TABS: 40.0000 mg | ORAL_TABLET | Freq: Two times a day (BID) | ORAL | Status: DC

## 2013-08-29 NOTE — Patient Instructions (Signed)
Your physician recommends that you schedule a follow-up appointment in:   3 MONTHS WITH DR [facility]  Your physician has recommended you make the following change in your medication:  TAKE  FUROSEMIDE  40 MG   TWICE  PER  DAY

## 2013-08-29 NOTE — Assessment & Plan Note (Signed)
Well controlled.  Continue current medications and low sodium Dash type diet.    

## 2013-08-29 NOTE — Progress Notes (Signed)
Patient ID: [name], male   DOB: [birth year], 63 y.o.   MRN: [id number] 63 y.o. seen in [place] 08/15/10 for SSCP. Enzymes negative ECG reviewed and had mild T wave changes inferiorly. Had cardiac CT which was read by Dr Reche Dixon. I reviewed the study on the [employer] portal. High likelyhood of > 70% calcific lesion in mid LAD. Patient has been stable and Rx medically . Previously followed at [facility] but just established at [facility]. CRF;s include DM on insulin, HTN and elevated lipids. Cath by Dr Excell Seltzer in May 2012 wit 60% LAD and negative flow wire. Medical Rx recommended. Since last being seen no chest pain. Seeing Dr Irene Limbo off [facility] Compliant with meds   Has home health helping check his BS. Has seen eye doctor and no retinopathy. [facility] and they [facility] his labs  Walking with no angina  Had myovue at [facility] ? With bicycle a month ago and "ok"  Has had RLE edema with ? Negative [place] at [place] Taking lasix daily   ROS: Denies fever, malais, weight loss, blurry vision, decreased visual acuity, cough, sputum, SOB, hemoptysis, pleuritic pain, palpitaitons, heartburn, abdominal pain, melena, lower extremity edema, claudication, or rash.  All other systems reviewed and negative  General: Affect appropriate Overweight black male  HEENT: normal Neck supple with no adenopathy JVP normal no bruits no thyromegaly Lungs clear with no wheezing and good diaphragmatic motion Heart:  S1/S2 no murmur, no rub, gallop or click PMI normal Abdomen: benighn, BS positve, no tenderness, no AAA no bruit.  No HSM or HJR Distal pulses intact with no bruits Plus 2 RLE edema Neuro non-focal Skin warm and dry No muscular weakness   Current Outpatient Prescriptions  Medication Sig Dispense Refill  . amitriptyline (ELAVIL) 25 MG tablet Take 25 mg by mouth at bedtime.        Marland Kitchen atorvastatin (LIPITOR) 20 MG tablet Take 20 mg by mouth daily.      . cloNIDine (CATAPRES) 0.2 MG tablet Take 0.2 mg by mouth 3  (three) times daily.      . cyanocobalamin 500 MCG tablet Take 500 mcg by mouth daily.        Marland Kitchen dicyclomine (BENTYL) 10 MG capsule Take 10 mg by mouth as directed.        . furosemide (LASIX) 40 MG tablet Take 40 mg by mouth daily.      Marland Kitchen gabapentin (NEURONTIN) 300 MG capsule Take 300 mg by mouth 3 (three) times daily.      . hydrALAZINE (APRESOLINE) 100 MG tablet Take 100 mg by mouth 3 (three) times daily.      . hydroxypropyl methylcellulose (ISOPTO TEARS) 2.5 % ophthalmic solution 1 drop.        Marland Kitchen ibuprofen (ADVIL,MOTRIN) 400 MG tablet Take 400 mg by mouth every 6 (six) hours as needed for pain.      Marland Kitchen insulin aspart (NOVOLOG) 100 UNIT/ML injection Inject into the skin as directed.        Marland Kitchen lisinopril (PRINIVIL,ZESTRIL) 40 MG tablet Take 40 mg by mouth daily.        . metFORMIN (GLUMETZA) 1000 MG (MOD) 24 hr tablet Take 1,000 mg by mouth 2 (two) times daily with a meal.       . metoprolol (LOPRESSOR) 50 MG tablet Take 50 mg by mouth 2 (two) times daily.      Marland Kitchen omeprazole (PRILOSEC) 20 MG capsule Take 20 mg by mouth daily.        Marland Kitchen  sertraline (ZOLOFT) 100 MG tablet Take 100 mg by mouth daily.       No current facility-administered medications for this visit.    Allergies  Review of patient's allergies indicates no known allergies.  Electrocardiogram:  SR rate 76 lateral T wave changes 2014  Today SR rat e65  Increased inferolateral T wave changes   Assessment and Plan

## 2013-08-29 NOTE — Assessment & Plan Note (Signed)
Discussed low carb diet.  Target hemoglobin A1c is 6.5 or less.  Continue current medications.  

## 2013-08-29 NOTE — Assessment & Plan Note (Signed)
Try bid lasix  If helps will need BMET in 4 weeks  Encouraged him to wear closed toe compression stockings Will try to get [place] result from [place]

## 2013-08-29 NOTE — Assessment & Plan Note (Signed)
Stable with no angina and good activity level.  Continue medical Rx Will try to get results of myovue from [place] done last month

## 2013-12-02 ENCOUNTER — Ambulatory Visit: Payer: Medicare Other | Admitting: Cardiovascular Disease

## 2013-12-30 DIAGNOSIS — E119 Type 2 diabetes mellitus without complications: Secondary | ICD-10-CM | POA: Diagnosis not present

## 2013-12-30 DIAGNOSIS — M79675 Pain in left toe(s): Secondary | ICD-10-CM | POA: Diagnosis not present

## 2013-12-30 DIAGNOSIS — I10 Essential (primary) hypertension: Secondary | ICD-10-CM | POA: Diagnosis not present

## 2013-12-30 DIAGNOSIS — B351 Tinea unguium: Secondary | ICD-10-CM | POA: Diagnosis not present

## 2014-01-09 ENCOUNTER — Ambulatory Visit: Payer: Medicare Other | Admitting: Cardiovascular Disease

## 2014-02-26 ENCOUNTER — Ambulatory Visit (INDEPENDENT_AMBULATORY_CARE_PROVIDER_SITE_OTHER)
Admission: RE | Admit: 2014-02-26 | Discharge: 2014-02-26 | Disposition: A | Discharge status: Home / Self Care | Payer: Medicare Other | Source: Ambulatory Visit | Attending: Cardiovascular Disease | Admitting: Cardiovascular Disease

## 2014-02-26 ENCOUNTER — Encounter (HOSPITAL_COMMUNITY): Payer: Self-pay | Admitting: Pharmacy Technician

## 2014-02-26 ENCOUNTER — Other Ambulatory Visit: Payer: Self-pay | Admitting: Cardiovascular Disease

## 2014-02-26 ENCOUNTER — Encounter: Payer: Self-pay | Admitting: Cardiovascular Disease

## 2014-02-26 ENCOUNTER — Ambulatory Visit (INDEPENDENT_AMBULATORY_CARE_PROVIDER_SITE_OTHER): Payer: Medicare Other | Admitting: Cardiovascular Disease

## 2014-02-26 ENCOUNTER — Encounter: Payer: Self-pay | Admitting: *Deleted

## 2014-02-26 VITALS — BP 110/56 | HR 72 | Ht 69.0 in | Wt 225.1 lb

## 2014-02-26 DIAGNOSIS — E108 Type 1 diabetes mellitus with unspecified complications: Secondary | ICD-10-CM | POA: Diagnosis not present

## 2014-02-26 DIAGNOSIS — I10 Essential (primary) hypertension: Secondary | ICD-10-CM | POA: Diagnosis not present

## 2014-02-26 DIAGNOSIS — I2510 Atherosclerotic heart disease of native coronary artery without angina pectoris: Secondary | ICD-10-CM | POA: Diagnosis not present

## 2014-02-26 DIAGNOSIS — I2583 Coronary atherosclerosis due to lipid rich plaque: Secondary | ICD-10-CM

## 2014-02-26 DIAGNOSIS — E785 Hyperlipidemia, unspecified: Secondary | ICD-10-CM

## 2014-02-26 DIAGNOSIS — Z01818 Encounter for other preprocedural examination: Secondary | ICD-10-CM

## 2014-02-26 DIAGNOSIS — I517 Cardiomegaly: Secondary | ICD-10-CM | POA: Diagnosis not present

## 2014-02-26 DIAGNOSIS — R079 Chest pain, unspecified: Secondary | ICD-10-CM | POA: Diagnosis not present

## 2014-02-26 LAB — CBC WITH DIFFERENTIAL/PLATELET
Basophils Absolute: 0.1 10*3/uL (ref 0.0–0.1)
Basophils Relative: 0.7 % (ref 0.0–3.0)
EOS ABS: 0.2 10*3/uL (ref 0.0–0.7)
EOS PCT: 2.6 % (ref 0.0–5.0)
HCT: 34.2 % — ABNORMAL LOW (ref 39.0–52.0)
Hemoglobin: 10.8 g/dL — ABNORMAL LOW (ref 13.0–17.0)
Lymphocytes Relative: 24.4 % (ref 12.0–46.0)
Lymphs Abs: 2 10*3/uL (ref 0.7–4.0)
MCHC: 31.7 g/dL (ref 30.0–36.0)
MCV: 76 fl — AB (ref 78.0–100.0)
Monocytes Absolute: 0.6 10*3/uL (ref 0.1–1.0)
Monocytes Relative: 7.1 % (ref 3.0–12.0)
NEUTROS PCT: 65.2 % (ref 43.0–77.0)
Neutro Abs: 5.4 10*3/uL (ref 1.4–7.7)
PLATELETS: 309 10*3/uL (ref 150.0–400.0)
RBC: 4.49 Mil/uL (ref 4.22–5.81)
RDW: 17.4 % — ABNORMAL HIGH (ref 11.5–15.5)
WBC: 8.3 10*3/uL (ref 4.0–10.5)

## 2014-02-26 LAB — BASIC METABOLIC PANEL
BUN: 12 mg/dL (ref 6–23)
CO2: 25 mEq/L (ref 19–32)
Calcium: 8.9 mg/dL (ref 8.4–10.5)
Chloride: 102 mEq/L (ref 96–112)
Creatinine, Ser: 1.1 mg/dL (ref 0.4–1.5)
GFR: 89.93 mL/min (ref >60.00)
Glucose, Bld: 101 mg/dL — ABNORMAL HIGH (ref 70–99)
POTASSIUM: 3 meq/L — AB (ref 3.5–5.1)
SODIUM: 138 meq/L (ref 135–145)

## 2014-02-26 LAB — PROTIME-INR
INR: 1 ratio (ref 0.8–1.0)
Prothrombin Time: 11.1 s (ref 9.6–13.1)

## 2014-02-26 MED ORDER — POTASSIUM CHLORIDE ER 10 MEQ PO TBCR: 40.0000 meq | EXTENDED_RELEASE_TABLET | Freq: Once | ORAL | Status: DC

## 2014-02-26 NOTE — Assessment & Plan Note (Signed)
Well controlled.  Continue current medications and low sodium Dash type diet.    

## 2014-02-26 NOTE — Assessment & Plan Note (Signed)
Discussed low carb diet.  Target hemoglobin A1c is 6.5 or less.  Continue current medications. NO oral hypoglycemic day of cath and 1/2 dose LA insulin night before

## 2014-02-26 NOTE — Patient Instructions (Addendum)
Your physician has requested that you have a cardiac catheterization. Cardiac catheterization is used to diagnose and/or treat various heart conditions. Doctors may recommend this procedure for a number of different reasons. The most common reason is to evaluate chest pain. Chest pain can be a symptom of coronary artery disease (CAD), and cardiac catheterization can show whether plaque is narrowing or blocking your heart's arteries. This procedure is also used to evaluate the valves, as well as measure the blood flow and oxygen levels in different parts of your heart. For further information please visit www.cardiosmart.org. Please follow instruction sheet, as given. Your physician recommends that you continue on your current medications as directed. Please refer to the Current Medication list given to you today. Your physician recommends that you return for lab work in: TODAY  BMET  CBC INR  A chest x-ray takes a picture of the organs and structures inside the chest, including the heart, lungs, and blood vessels. This test can show several things, including, whether the heart is enlarges; whether fluid is building up in the lungs; and whether pacemaker / defibrillator leads are still in place. 

## 2014-02-26 NOTE — Progress Notes (Signed)
Patient ID: [name], male   DOB: 05-14-1950, 63 y.o.   MRN: [id number] 63 yo seen in [place] 08/15/10 for SSCP. Enzymes negative ECG reviewed and had mild T wave changes inferiorly. Had cardiac CT which was read by Dr Reche Dixonalbot. I reviewed the study on the [employer] portal. High likelyhood of > 70% calcific lesion in mid LAD. Patient has been stable and Rx medically . [facility]. CRF;s include DM on insulin, HTN and elevated lipids. Cath by Dr Excell Seltzerooper in May 2012 wit 60% LAD and negative flow wire. Medical Rx recommended. Since last being seen no chest pain. [facility] Compl[facility] with meds   Has home health helping check his BS. Has seen eye doctor and no retinopathy. [facility] and [facility]   [place] [facility] [place] and "ok" Has had R[place] [facility] Taking [place] daily  Having increasing SSCP with walking 3-4/week  Pain relieved with nitro No rest pain.  Has been having more pain last 2 months     ROS: Denies fever, malais, weight loss, blurry vision, decreased visual acuity, cough, sputum, SOB, hemoptysis, pleuritic pain, palpitaitons, heartburn, abdominal pain, melena, lower extremity edema, claudication, or rash.  All other systems reviewed and negative  General: Affect appropriate Overweight black male  HEENT: normal Neck supple with no adenopathy JVP normal no bruits no thyromegaly Lungs clear with no wheezing and good diaphragmatic motion Heart:  S1/S2 no murmur, no rub, gallop or click PMI normal Abdomen: benighn, BS positve, no tenderness, no AAA no bruit.  No HSM or HJR Distal pulses intact with no bruits No edema Neuro non-focal Skin warm and dry No muscular weakness   Current Outpatient Prescriptions  Medication Sig Dispense Refill  . amitriptyline (ELAVIL) 25 MG tablet Take 25 mg by mouth at bedtime.      Marland Kitchen. atorvastatin (LIPITOR) 20 MG tablet  Take 20 mg by mouth daily.    . cloNIDine (CATAPRES) 0.2 MG tablet Take 0.2 mg by mouth 3 (three) times daily.    . cyanocobalamin 500 MCG tablet Take 500 mcg by mouth daily.      Marland Kitchen. dicyclomine (BENTYL) 10 MG capsule Take 10 mg by mouth as directed.      . furosemide (LASIX) 40 MG tablet Take 1 tablet (40 mg total) by mouth 2 (two) times daily. 60 tablet 11  . gabapentin (NEURONTIN) 300 MG capsule Take 300 mg by mouth 3 (three) times daily.    . hydrALAZINE (APRESOLINE) 100 MG tablet Take 100 mg by mouth 3 (three) times daily.    . hydroxypropyl methylcellulose (ISOPTO TEARS) 2.5 % ophthalmic solution 1 drop.      Marland Kitchen. ibuprofen (ADVIL,MOTRIN) 400 MG tablet Take 400 mg by mouth every 6 (six) hours as needed for pain.    Marland Kitchen. insulin aspart (NOVOLOG) 100 UNIT/ML injection Inject into the skin as directed.      Marland Kitchen. lisinopril (PRINIVIL,ZESTRIL) 40 MG tablet Take 40 mg by mouth daily.      . metFORMIN (GLUMETZA) 1000 MG (MOD) 24 hr tablet Take 1,000 mg by mouth 2 (two) times daily with a meal.     . metoprolol (LOPRESSOR) 50 MG tablet Take 50 mg by mouth 2 (two) times daily.    Marland Kitchen. omeprazole (PRILOSEC) 20 MG capsule Take 20 mg by mouth daily.      Marland Kitchen. venlafaxine [facility](EFFEXOR)  75 MG tablet Take 75 mg by mouth 2 (two) times daily.     No current facility-administered medications for this visit.    Allergies  Review of patient's allergies indicates no known allergies.  Electrocardiogram:  NSR inferolateral T wave changes   Assessment and Plan

## 2014-02-26 NOTE — Assessment & Plan Note (Signed)
Recurrent pain in diabetic with abnormal ECG and known 60% LAD lesion  Discussed with patient and recommended cath [facility]  Labs done orders written Risks including stroke , MI surgery and bleeding discussed Willing To proceed Right radial approach

## 2014-02-26 NOTE — Assessment & Plan Note (Signed)
Cholesterol is at goal.  Continue current dose of statin and diet Rx.  No myalgias or side effects.  F/U  LFT's in 6 months. [facility]: [facility]

## 2014-02-27 ENCOUNTER — Encounter (HOSPITAL_COMMUNITY)
Admission: RE | Disposition: A | Discharge status: Home / Self Care | Payer: Self-pay | Source: Ambulatory Visit | Attending: Cardiovascular Disease

## 2014-02-27 ENCOUNTER — Ambulatory Visit (HOSPITAL_COMMUNITY)
Admission: RE | Admit: 2014-02-27 | Discharge: 2014-02-27 | Disposition: A | Discharge status: Home / Self Care | Payer: Medicare Other | Source: Ambulatory Visit | Attending: Cardiovascular Disease | Admitting: Cardiovascular Disease

## 2014-02-27 DIAGNOSIS — R079 Chest pain, unspecified: Secondary | ICD-10-CM | POA: Insufficient documentation

## 2014-02-27 DIAGNOSIS — E119 Type 2 diabetes mellitus without complications: Secondary | ICD-10-CM | POA: Insufficient documentation

## 2014-02-27 DIAGNOSIS — I10 Essential (primary) hypertension: Secondary | ICD-10-CM | POA: Diagnosis not present

## 2014-02-27 DIAGNOSIS — Z794 Long term (current) use of insulin: Secondary | ICD-10-CM | POA: Insufficient documentation

## 2014-02-27 DIAGNOSIS — I2510 Atherosclerotic heart disease of native coronary artery without angina pectoris: Secondary | ICD-10-CM | POA: Diagnosis not present

## 2014-02-27 HISTORY — PX: LEFT HEART CATHETERIZATION WITH CORONARY ANGIOGRAM: SHX5451

## 2014-02-27 LAB — GLUCOSE, CAPILLARY
Glucose-Capillary: 184 mg/dL — ABNORMAL HIGH (ref 70–99)
Glucose-Capillary: 110 mg/dL — ABNORMAL HIGH (ref 70–99)

## 2014-02-27 LAB — POTASSIUM: POTASSIUM: 4 meq/L (ref 3.7–5.3)

## 2014-02-27 SURGERY — LEFT HEART CATHETERIZATION WITH CORONARY ANGIOGRAM
Anesthesia: LOCAL

## 2014-02-27 MED ORDER — POTASSIUM CHLORIDE 20 MEQ PO PACK
40.0000 meq | PACK | Freq: Once | ORAL | Status: AC
  Administered 2014-02-27: 40 meq via ORAL

## 2014-02-27 MED ORDER — SODIUM CHLORIDE 0.9 % IJ SOLN: 3.0000 mL | INTRAMUSCULAR | Status: DC | PRN

## 2014-02-27 MED ORDER — SODIUM CHLORIDE 0.9 % IJ SOLN: 3.0000 mL | Freq: Two times a day (BID) | INTRAMUSCULAR | Status: DC

## 2014-02-27 MED ORDER — POTASSIUM CHLORIDE CRYS ER 20 MEQ PO TBCR
EXTENDED_RELEASE_TABLET | ORAL | Status: AC
  Filled 2014-02-27: qty 2

## 2014-02-27 MED ORDER — POTASSIUM CHLORIDE IN NACL 20-0.45 MEQ/L-% IV SOLN: INTRAVENOUS | Status: DC

## 2014-02-27 MED ORDER — ASPIRIN 81 MG PO CHEW: 81.0000 mg | CHEWABLE_TABLET | ORAL | Status: DC

## 2014-02-27 MED ORDER — SODIUM CHLORIDE 0.9 % IV SOLN: 250.0000 mL | INTRAVENOUS | Status: DC | PRN

## 2014-02-27 MED ORDER — SODIUM CHLORIDE 0.9 % IV SOLN
250.0000 mL | INTRAVENOUS | Status: DC | PRN
Start: 2014-02-27 — End: 2014-02-27
  Administered 2014-02-27: 1000 mL via INTRAVENOUS

## 2014-02-27 MED ORDER — OXYCODONE-ACETAMINOPHEN 5-325 MG PO TABS: 1.0000 | ORAL_TABLET | ORAL | Status: DC | PRN

## 2014-02-27 NOTE — CV Procedure (Addendum)
    Cardiac Catheterization Procedure Note  Name: [name] MRN: [id number] DOB: [birth year]  Procedure: Left Heart Cath, Selective Coronary Angiography, LV angiography  Indication:  Chest Pain   Procedural Details: The right wrist was prepped, draped, and anesthetized with 1% lidocaine. Using the modified Seldinger technique, a 5/6 French Slender sheath was introduced into the right radial artery. 3 mg of verapamil was administered through the sheath, weight-based unfractionated heparin was administered intravenously. Standard Judkins catheters were used for selective coronary angiography and left ventriculography. Catheter exchanges were performed over an exchange length guidewire. There were no immediate procedural complications. A TR band was used for radial hemostasis at the completion of the procedure.  The patient was transferred to the post catheterization recovery area for further monitoring.  Procedural Findings: Hemodynamics: AO  133 68 LV  126 8  Coronary angiography: Coronary dominance: right  Left mainstem:  normal  Left anterior descending (LAD):  30% proximal , 40%-50%  mid  ( at previous site that was called 60% and flow wired )  normal distal   D1 40% ostial  D2 small and normal  Left circumflex (LCx):  Normal   OM1: normal  OM2: large and normal  Right coronary artery (RCA):  Large dominant and normal  Left ventriculography: Left ventricular systolic function is normal, LVEF is estimated at 55-65%, there is no significant mitral regurgitation      Recommendations:  No hemodynamically significant CAD  Medical Rx  Note RCA was hard to engage from right radial approach  Used EZ- RAD right to finally engage  Charlton HawsPeter Antwan Pandya MD, [facility] 02/27/2014, 1:36 PM

## 2014-02-27 NOTE — Discharge Instructions (Signed)
Radial Site Care °Refer to this sheet in the next few weeks. These instructions provide you with information on caring for yourself after your procedure. Your caregiver may also give you more specific instructions. Your treatment has been planned according to current medical practices, but problems sometimes occur. Call your caregiver if you have any problems or questions after your procedure. °HOME CARE INSTRUCTIONS °· You may shower the day after the procedure. Remove the bandage (dressing) and gently wash the site with plain soap and water. Gently pat the site dry. °· Do not apply powder or lotion to the site. °· Do not submerge the affected site in water for 3 to 5 days. °· Inspect the site at least twice daily. °· Do not flex or bend the affected arm for 24 hours. °· No lifting over 5 pounds (2.3 kg) for 5 days after your procedure. °· Do not drive home if you are discharged the same day of the procedure. Have someone else drive you. °· You may drive 24 hours after the procedure unless otherwise instructed by your caregiver. °· Do not operate machinery or power tools for 24 hours. °· A responsible adult should be with you for the first 24 hours after you arrive home. °What to expect: °· Any bruising will usually fade within 1 to 2 weeks. °· Blood that collects in the tissue (hematoma) may be painful to the touch. It should usually decrease in size and tenderness within 1 to 2 weeks. °SEEK IMMEDIATE MEDICAL CARE IF: °· You have unusual pain at the radial site. °· You have redness, warmth, swelling, or pain at the radial site. °· You have drainage (other than a small amount of blood on the dressing). °· You have chills. °· You have a fever or persistent symptoms for more than 72 hours. °· You have a fever and your symptoms suddenly get worse. °· Your arm becomes pale, cool, tingly, or numb. °· You have heavy bleeding from the site. Hold pressure on the site. °Document Released: 04/15/2010 Document Revised:  06/05/2011 Document Reviewed: 04/15/2010 °ExitCare® Patient Information ©2015 [employer]. This information is not intended to replace advice given to you by your health care provider. Make sure you discuss any questions you have with your health care provider. ° °

## 2014-02-27 NOTE — Interval H&P Note (Signed)
History and Physical Interval Note:  02/27/2014 10:26 AM  Linde GillisRuffin Baumgartner  has presented today for surgery, with the diagnosis of cp  The various methods of treatment have been discussed with the patient and family. After consideration of risks, benefits and other options for treatment, the patient has consented to  Procedure(s): LEFT HEART CATHETERIZATION WITH CORONARY ANGIOGRAM (N/A) as a surgical intervention .  The patient's history has been reviewed, patient examined, no change in status, stable for surgery.  I have reviewed the patient's chart and labs.  Questions were answered to the patient's satisfaction.     Charlton HawsPeter Kazimir Hartnett

## 2014-02-27 NOTE — H&P (View-Only) (Signed)
Patient ID: [name], male   DOB: 10/25/1950, 63 y.o.   MRN: [id number] 63 yo seen in [place] 08/15/10 for SSCP. Enzymes negative ECG reviewed and had mild T wave changes inferiorly. Had cardiac CT which was read by Dr Talbot. I reviewed the study on the [employer] portal. High likelyhood of > 70% calcific lesion in mid LAD. Patient has been stable and Rx medically . [facility]. CRF;s include DM on insulin, HTN and elevated lipids. Cath by Dr Cooper in May 2012 wit 60% LAD and negative flow wire. Medical Rx recommended. Since last being seen no chest pain. Seeing Dr Sammy off [facility] Compliant with meds   Has home health helping check his BS. Has seen eye doctor and no retinopathy. [facility] and [facility]   [facility] ? With [facility] 5/15 and "ok" Has had RLE edema with ? [facility] Taking lasix daily  Having increasing SSCP with walking 3-4/week  Pain relieved with nitro No rest pain.  Has been having more pain last 2 months     ROS: Denies fever, malais, weight loss, blurry vision, decreased visual acuity, cough, sputum, SOB, hemoptysis, pleuritic pain, palpitaitons, heartburn, abdominal pain, melena, lower extremity edema, claudication, or rash.  All other systems reviewed and negative  General: Affect appropriate Overweight black male  HEENT: normal Neck supple with no adenopathy JVP normal no bruits no thyromegaly Lungs clear with no wheezing and good diaphragmatic motion Heart:  S1/S2 no murmur, no rub, gallop or click PMI normal Abdomen: benighn, BS positve, no tenderness, no AAA no bruit.  No HSM or HJR Distal pulses intact with no bruits No edema Neuro non-focal Skin warm and dry No muscular weakness   Current Outpatient Prescriptions  Medication Sig Dispense Refill  . amitriptyline (ELAVIL) 25 MG tablet Take 25 mg by mouth at bedtime.      . atorvastatin (LIPITOR) 20 MG tablet  Take 20 mg by mouth daily.    . cloNIDine (CATAPRES) 0.2 MG tablet Take 0.2 mg by mouth 3 (three) times daily.    . cyanocobalamin 500 MCG tablet Take 500 mcg by mouth daily.      . dicyclomine (BENTYL) 10 MG capsule Take 10 mg by mouth as directed.      . furosemide (LASIX) 40 MG tablet Take 1 tablet (40 mg total) by mouth 2 (two) times daily. 60 tablet 11  . gabapentin (NEURONTIN) 300 MG capsule Take 300 mg by mouth 3 (three) times daily.    . hydrALAZINE (APRESOLINE) 100 MG tablet Take 100 mg by mouth 3 (three) times daily.    . hydroxypropyl methylcellulose (ISOPTO TEARS) 2.5 % ophthalmic solution 1 drop.      . ibuprofen (ADVIL,MOTRIN) 400 MG tablet Take 400 mg by mouth every 6 (six) hours as needed for pain.    . insulin aspart (NOVOLOG) 100 UNIT/ML injection Inject into the skin as directed.      . lisinopril (PRINIVIL,ZESTRIL) 40 MG tablet Take 40 mg by mouth daily.      . metFORMIN (GLUMETZA) 1000 MG (MOD) 24 hr tablet Take 1,000 mg by mouth 2 (two) times daily with a meal.     . metoprolol (LOPRESSOR) 50 MG tablet Take 50 mg by mouth 2 (two) times daily.    . omeprazole (PRILOSEC) 20 MG capsule Take 20 mg by mouth daily.      . venlafaxine (EFFEXOR)   75 MG tablet Take 75 mg by mouth 2 (two) times daily.     No current facility-administered medications for this visit.    Allergies  Review of patient's allergies indicates no known allergies.  Electrocardiogram:  NSR inferolateral T wave changes   Assessment and Plan

## 2014-03-03 MED FILL — Lidocaine HCl Local Preservative Free (PF) Inj 1%: INTRAMUSCULAR | Qty: 30 | Status: AC

## 2014-03-03 MED FILL — Midazolam HCl Inj 2 MG/2ML (Base Equivalent): INTRAMUSCULAR | Qty: 2 | Status: AC

## 2014-03-03 MED FILL — Heparin Sodium (Porcine) Inj 1000 Unit/ML: INTRAMUSCULAR | Qty: 10 | Status: AC

## 2014-03-03 MED FILL — Fentanyl Citrate Inj 0.05 MG/ML: INTRAMUSCULAR | Qty: 2 | Status: AC

## 2014-03-03 MED FILL — Heparin Sodium (Porcine) 2 Unit/ML in Sodium Chloride 0.9%: INTRAMUSCULAR | Qty: 1000 | Status: AC

## 2014-03-03 MED FILL — Verapamil HCl IV Soln 2.5 MG/ML: INTRAVENOUS | Qty: 2 | Status: AC

## 2014-03-03 MED FILL — Nitroglycerin IV Soln 100 MCG/ML in D5W: INTRA_ARTERIAL | Qty: 10 | Status: AC

## 2014-03-05 ENCOUNTER — Encounter (HOSPITAL_COMMUNITY): Payer: Self-pay | Admitting: Cardiovascular Disease

## 2014-03-11 ENCOUNTER — Telehealth: Payer: Self-pay | Admitting: Cardiovascular Disease

## 2014-03-11 NOTE — Telephone Encounter (Signed)
New message ° ° ° ° ° °Returning a nurses call °

## 2014-03-11 NOTE — Telephone Encounter (Signed)
SEE LAB RESULTS ./CY 

## 2014-03-11 NOTE — Telephone Encounter (Signed)
F/U     Pt calling again, states he's returning call from earlier. Best contact [phone].

## 2014-03-11 NOTE — Telephone Encounter (Signed)
Follow Up ° ° °Pt calling returning call from earlier. Please call. °

## 2014-03-17 ENCOUNTER — Telehealth: Payer: Self-pay | Admitting: Cardiovascular Disease

## 2014-03-17 NOTE — Telephone Encounter (Signed)
[facility]I faxed to [facility] in [place]

## 2015-05-03 ENCOUNTER — Encounter: Payer: Self-pay | Admitting: *Deleted

## 2015-05-03 NOTE — Progress Notes (Signed)
Patient ID: [name], male   DOB: 02/12/1951, 65 y.o.   MRN: [id number]   65 y.o. seen in [place] 08/15/10 for SSCP. Enzymes negative ECG reviewed and had mild T wave changes inferiorly. Had cardiac CT which was read by Dr Reche Dixon. I reviewed the study on the [employer] portal. High likelyhood of > 70% calcific lesion in mid LAD. Patient has been stable and Rx medically . Previously followed at [facility] but just established at [facility]. CRF;s include DM on insulin, HTN and elevated lipids. Cath by Dr Excell Seltzer in May 2012 wit 60% LAD and negative flow wire. Medical Rx recommended. Since last being seen no chest pain. Seeing Dr Irene Limbo off [facility] Compliant with meds   Has home health helping check his BS. Has seen eye doctor and no retinopathy. [facility] and [facility]   Had myovue at [place] ? With bicycle 5/15 and "ok" Has had RLE edema with ? Negative [place] at [place] Taking lasix daily  02/2014  Having increasing SSCP with walking 3-4/week  Pain relieved with nitro No rest pain.  Has been having more pain last 2 months   Cath 02/2014  With 40% D1 and 40%  Mid LAD no critical lesions and normal EF   Still with occasional chest pain some GI overtones Relief with belching However taking nitro on occasion with relief Causes a headache  ROS: Denies fever, malais, weight loss, blurry vision, decreased visual acuity, cough, sputum, SOB, hemoptysis, pleuritic pain, palpitaitons, heartburn, abdominal pain, melena, lower extremity edema, claudication, or rash.  All other systems reviewed and negative  General: Affect appropriate Overweight black male  HEENT: normal Neck supple with no adenopathy JVP normal no bruits no thyromegaly Lungs clear with no wheezing and good diaphragmatic motion Heart:  S1/S2 SEM  murmur, no rub, gallop or click PMI normal Abdomen: benighn, BS positve, no tenderness, no AAA no bruit.  No HSM or HJR Distal pulses intact with no bruits No edema Neuro  non-focal Skin warm and dry No muscular weakness   Current Outpatient Prescriptions  Medication Sig Dispense Refill  . amitriptyline (ELAVIL) 25 MG tablet Take 25 mg by mouth at bedtime.      Marland Kitchen amLODipine (NORVASC) 10 MG tablet Take 10 mg by mouth daily.    Marland Kitchen aspirin EC 81 MG tablet Take 81 mg by mouth daily.    Marland Kitchen atorvastatin (LIPITOR) 20 MG tablet Take 20 mg by mouth daily.    . cloNIDine (CATAPRES) 0.2 MG tablet Take 0.2 mg by mouth 3 (three) times daily.    . clotrimazole (LOTRIMIN) 1 % cream Apply 1 application topically daily as needed (for athletes feet).     . cyanocobalamin 500 MCG tablet Take 1,000 mcg by mouth daily.     Marland Kitchen dicyclomine (BENTYL) 10 MG capsule Take 10 mg by mouth 2 (two) times daily.     . ferrous sulfate 325 (65 FE) MG tablet Take 325 mg by mouth 2 (two) times daily.    Marland Kitchen gabapentin (NEURONTIN) 300 MG capsule Take 300 mg by mouth 3 (three) times daily.    . hydrALAZINE (APRESOLINE) 100 MG tablet Take 100 mg by mouth 3 (three) times daily.    . hydroxypropyl methylcellulose (ISOPTO TEARS) 2.5 % ophthalmic solution Place 1-2 drops into both eyes as needed for dry eyes.     Marland Kitchen ibuprofen (ADVIL,MOTRIN) 400 MG tablet Take 400 mg by mouth every 6 (six) hours as needed for pain.    Marland Kitchen  insulin aspart (NOVOLOG) 100 UNIT/ML injection Inject 12 Units into the skin 3 (three) times daily.     . insulin glargine (LANTUS) 100 UNIT/ML injection Inject 50 Units into the skin at bedtime.    Marland Kitchen lisinopril (PRINIVIL,ZESTRIL) 40 MG tablet Take 40 mg by mouth daily.      . metFORMIN (GLUMETZA) 1000 MG (MOD) 24 hr tablet Take 1,000 mg by mouth 2 (two) times daily with a meal.     . metoprolol (LOPRESSOR) 50 MG tablet Take 50 mg by mouth 2 (two) times daily.    Marland Kitchen omeprazole (PRILOSEC) 20 MG capsule Take 40 mg by mouth daily.     . sertraline (ZOLOFT) 100 MG tablet Take 50 mg by mouth daily.    . Vitamin D, Cholecalciferol, 1000 UNITS TABS Take 2,000 Units by mouth daily.     No current  facility-administered medications for this visit.   Facility-Administered Medications Ordered in Other Visits  Medication Dose Route Frequency Provider Last Rate Last Dose  . potassium chloride (K-DUR) CR tablet 40 mEq  40 mEq Oral Once Wendall Stade, MD        Allergies  Atenolol; Gemfibrozil; Simvastatin; and Lac bovis  Electrocardiogram:  02/26/14   NSR inferolateral T wave changes  05/04/15  SR rate 79  Chronic inferolateral T wave changes   Assessment and Plan CAD:  Non obstructive by cath 02/2014  Continue ASA , beta blocker and statin Not clear if he has microvascular dx.  Add imdur 15 mg DM:  Discussed low carb diet.  Target hemoglobin A1c is 6.5 or less.  Continue current medications. HTN:  Well controlled.  Continue current medications and low sodium Dash type diet.    F/u next available   Charlton Haws

## 2015-05-04 ENCOUNTER — Encounter: Payer: Self-pay | Admitting: Cardiovascular Disease

## 2015-05-04 ENCOUNTER — Ambulatory Visit (INDEPENDENT_AMBULATORY_CARE_PROVIDER_SITE_OTHER): Payer: Medicare Other | Admitting: Cardiovascular Disease

## 2015-05-04 VITALS — BP 130/52 | HR 86 | Ht 69.0 in | Wt 226.8 lb

## 2015-05-04 DIAGNOSIS — I10 Essential (primary) hypertension: Secondary | ICD-10-CM

## 2015-05-04 DIAGNOSIS — I2510 Atherosclerotic heart disease of native coronary artery without angina pectoris: Secondary | ICD-10-CM | POA: Diagnosis not present

## 2015-05-04 MED ORDER — ISOSORBIDE MONONITRATE ER 30 MG PO TB24: 15.0000 mg | ORAL_TABLET | Freq: Every day | ORAL | Status: DC

## 2015-05-04 NOTE — Patient Instructions (Addendum)
Medication Instructions:  Your physician has recommended you make the following change in your medication:  1- START Imdur 15 mg (1/2 tablet) by mouth daily  Labwork: NONE  Testing/Procedures: NONE  Follow-Up: Your physician wants you to follow-up next available with Dr. Eden Emms.   If you need a refill on your cardiac medications before your next appointment, please call your pharmacy.

## 2015-06-08 ENCOUNTER — Ambulatory Visit: Payer: Medicare Other | Admitting: Cardiovascular Disease

## 2015-08-03 ENCOUNTER — Ambulatory Visit: Payer: Medicare Other | Admitting: Cardiovascular Disease

## 2015-08-04 NOTE — Progress Notes (Signed)
Patient ID: [name], male   DOB: 04-01-1950, 65 y.o.   MRN: [id number]   65 y.o. seen in [place] 08/15/10 for SSCP. Enzymes negative ECG reviewed and had mild T wave changes inferiorly. Had cardiac CT which was read by Dr Reche Dixon. I reviewed the study on the [employer] portal. High likelyhood of > 70% calcific lesion in mid LAD. Patient has been stable and Rx medically . Previously followed at [facility] but just established at [facility]. CRF;s include DM on insulin, HTN and elevated lipids. Cath by Dr Excell Seltzer in May 2012 wit 60% LAD and negative flow wire. Medical Rx recommended.  Has home health helping check his BS. Has seen eye doctor and no retinopathy. [facility] and [facility]   Had myovue at [place] ? With bicycle 5/15 and "ok" Has had RLE edema with ? Negative [place] at [place] Taking lasix daily  Has been having more pain last 2 months   Cath 02/2014  With 40% D1 and 40%  Mid LAD no critical lesions and normal EF   Still with occasional chest pain some GI overtones Relief with belching However taking nitro on occasion with relief Causes a headache Imdur added last visit.  Seen at the [place] in [place].  Had exercise myovue with positive ECG Patient had copy of report on his phone  Indicated "abnromal perfusion but not in a typical coronary distribution"  Long discussion with patient. Last cath 1.5 years ago On good medical Rx Need repeat cath to r/o progression of disease. Last cath from right radial RCA hard To cannulate finally engaged with EZ RAD RCA.   ROS: Denies fever, malais, weight loss, blurry vision, decreased visual acuity, cough, sputum, SOB, hemoptysis, pleuritic pain, palpitaitons, heartburn, abdominal pain, melena, lower extremity edema, claudication, or rash.  All other systems reviewed and negative  General: Affect appropriate Overweight black male  HEENT: normal Neck supple with no adenopathy JVP normal no bruits no thyromegaly Lungs clear with no  wheezing and good diaphragmatic motion Heart:  S1/S2 SEM  murmur, no rub, gallop or click PMI normal Abdomen: benighn, BS positve, no tenderness, no AAA no bruit.  No HSM or HJR Distal pulses intact with no bruits No edema Neuro non-focal Skin warm and dry No muscular weakness   Current Outpatient Prescriptions  Medication Sig Dispense Refill  . amitriptyline (ELAVIL) 25 MG tablet Take 25 mg by mouth at bedtime.      Marland Kitchen amLODipine (NORVASC) 10 MG tablet Take 10 mg by mouth daily.    Marland Kitchen aspirin EC 81 MG tablet Take 81 mg by mouth daily.    Marland Kitchen atorvastatin (LIPITOR) 20 MG tablet Take 20 mg by mouth daily.    . cloNIDine (CATAPRES) 0.2 MG tablet Take 0.2 mg by mouth 3 (three) times daily.    . clotrimazole (LOTRIMIN) 1 % cream Apply 1 application topically daily as needed (for athletes feet).     . cyanocobalamin 500 MCG tablet Take 1,000 mcg by mouth daily.     Marland Kitchen dicyclomine (BENTYL) 10 MG capsule Take 10 mg by mouth 2 (two) times daily.     . ferrous sulfate 325 (65 FE) MG tablet Take 325 mg by mouth 2 (two) times daily.    Marland Kitchen gabapentin (NEURONTIN) 300 MG capsule Take 300 mg by mouth 3 (three) times daily.    . hydrALAZINE (APRESOLINE) 100 MG tablet Take 100 mg by mouth 3 (three) times daily.    . hydroxypropyl  methylcellulose (ISOPTO TEARS) 2.5 % ophthalmic solution Place 1-2 drops into both eyes as needed for dry eyes.     Marland Kitchen. ibuprofen (ADVIL,MOTRIN) 400 MG tablet Take 400 mg by mouth every 6 (six) hours as needed for pain.    Marland Kitchen. insulin aspart (NOVOLOG) 100 UNIT/ML injection Inject 12 Units into the skin 3 (three) times daily.     . insulin glargine (LANTUS) 100 UNIT/ML injection Inject 50 Units into the skin at bedtime.    . isosorbide mononitrate (IMDUR) 30 MG 24 hr tablet Take 0.5 tablets (15 mg total) by mouth daily. 45 tablet 3  . lisinopril (PRINIVIL,ZESTRIL) 40 MG tablet Take 40 mg by mouth daily.      . metFORMIN (GLUMETZA) 1000 MG (MOD) 24 hr tablet Take 1,000 mg by mouth 2 (two)  times daily with a meal.     . metoprolol (LOPRESSOR) 50 MG tablet Take 50 mg by mouth 2 (two) times daily.    Marland Kitchen. omeprazole (PRILOSEC) 20 MG capsule Take 40 mg by mouth daily.     . sertraline (ZOLOFT) 100 MG tablet Take 50 mg by mouth daily.    . Vitamin D, Cholecalciferol, 1000 UNITS TABS Take 2,000 Units by mouth daily.     No current facility-administered medications for this visit.   Facility-Administered Medications Ordered in Other Visits  Medication Dose Route Frequency Provider Last Rate Last Dose  . potassium chloride (K-DUR) CR tablet 40 mEq  40 mEq Oral Once Wendall StadePeter C Janssen Zee, MD        Allergies  Atenolol; Gemfibrozil; Simvastatin; and Lac bovis  Electrocardiogram:  02/26/14   NSR inferolateral T wave changes  05/04/15  SR rate 79  Chronic inferolateral T wave changes   Assessment and Plan CAD:  Non obstructive by cath 02/2014  Recurrent pain despite good medical Rx [facility]  Will get Official report. Discussed with patient need for repeat cath. Risks including stroke, bleeding MI and emergency surgery discussed  Willing to proceed Lab called scheduled at 1:00 5/17 Orders written   DM:  Discussed low carb diet.  Target hemoglobin A1c is 6.5 or less.  Continue current medications. HTN:  Well controlled.  Continue current medications and low sodium Dash type diet.    F/u next available  Will have pre cath labs today   Charlton HawsPeter Tremel Setters

## 2015-08-05 ENCOUNTER — Encounter: Payer: Self-pay | Admitting: Cardiovascular Disease

## 2015-08-05 ENCOUNTER — Ambulatory Visit (INDEPENDENT_AMBULATORY_CARE_PROVIDER_SITE_OTHER): Payer: Medicare Other | Admitting: Cardiovascular Disease

## 2015-08-05 VITALS — BP 118/60 | HR 73 | Ht 69.0 in | Wt 224.1 lb

## 2015-08-05 DIAGNOSIS — I209 Angina pectoris, unspecified: Secondary | ICD-10-CM

## 2015-08-05 DIAGNOSIS — Z01812 Encounter for preprocedural laboratory examination: Secondary | ICD-10-CM | POA: Diagnosis not present

## 2015-08-05 DIAGNOSIS — I2510 Atherosclerotic heart disease of native coronary artery without angina pectoris: Secondary | ICD-10-CM

## 2015-08-05 DIAGNOSIS — R079 Chest pain, unspecified: Secondary | ICD-10-CM | POA: Diagnosis not present

## 2015-08-05 LAB — CBC WITH DIFFERENTIAL/PLATELET
BASOS PCT: 0 %
Basophils Absolute: 0 cells/uL (ref 0–200)
EOS PCT: 3 %
Eosinophils Absolute: 204 cells/uL (ref 15–500)
HCT: 31.2 % — ABNORMAL LOW (ref 38.5–50.0)
Hemoglobin: 9.8 g/dL — ABNORMAL LOW (ref 13.2–17.1)
LYMPHS PCT: 26 %
Lymphs Abs: 1768 cells/uL (ref 850–3900)
MCH: 23.8 pg — ABNORMAL LOW (ref 27.0–33.0)
MCHC: 31.4 g/dL — ABNORMAL LOW (ref 32.0–36.0)
MCV: 75.7 fL — ABNORMAL LOW (ref 80.0–100.0)
MONOS PCT: 8 %
MPV: 8.9 fL (ref 7.5–12.5)
Monocytes Absolute: 544 cells/uL (ref 200–950)
Neutro Abs: 4284 cells/uL (ref 1500–7800)
Neutrophils Relative %: 63 %
Platelets: 270 10*3/uL (ref 140–400)
RBC: 4.12 MIL/uL — AB (ref 4.20–5.80)
RDW: 16.1 % — AB (ref 11.0–15.0)
WBC: 6.8 10*3/uL (ref 3.8–10.8)

## 2015-08-05 NOTE — Patient Instructions (Addendum)
Medication Instructions:  Your physician recommends that you continue on your current medications as directed. Please refer to the Current Medication list given to you today.  Labwork: Your physician recommends that you have lab work today-  BMET, CBC, PT/INR  Testing/Procedures: Your physician has requested that you have a cardiac catheterization. Cardiac catheterization is used to diagnose and/or treat various heart conditions. Doctors may recommend this procedure for a number of different reasons. The most common reason is to evaluate chest pain. Chest pain can be a symptom of coronary artery disease (CAD), and cardiac catheterization can show whether plaque is narrowing or blocking your heart's arteries. This procedure is also used to evaluate the valves, as well as measure the blood flow and oxygen levels in different parts of your heart. For further information please visit www.cardiosmart.org. Please follow instruction sheet, as given.  Follow-Up: Your physician wants you to follow-up in: 6 months with Dr. Nishan. You will receive a reminder letter in the mail two months in advance. If you don't receive a letter, please call our office to schedule the follow-up appointment.   If you need a refill on your cardiac medications before your next appointment, please call your pharmacy.    

## 2015-08-06 LAB — BASIC METABOLIC PANEL
BUN: 17 mg/dL (ref 7–25)
CALCIUM: 8.9 mg/dL (ref 8.6–10.3)
CO2: 22 mmol/L (ref 20–31)
Chloride: 106 mmol/L (ref 98–110)
Creat: 1.31 mg/dL — ABNORMAL HIGH (ref 0.70–1.25)
Glucose, Bld: 105 mg/dL — ABNORMAL HIGH (ref 65–99)
Potassium: 4.4 mmol/L (ref 3.5–5.3)
SODIUM: 139 mmol/L (ref 135–146)

## 2015-08-06 LAB — PROTIME-INR
INR: 1.01 (ref <1.50)
PROTHROMBIN TIME: 13.4 s (ref 11.6–15.2)

## 2015-08-11 ENCOUNTER — Ambulatory Visit (HOSPITAL_COMMUNITY)
Admission: RE | Admit: 2015-08-11 | Discharge: 2015-08-11 | Disposition: A | Discharge status: Home / Self Care | Payer: Medicare Other | Source: Ambulatory Visit | Attending: Cardiovascular Disease | Admitting: Cardiovascular Disease

## 2015-08-11 ENCOUNTER — Encounter (HOSPITAL_COMMUNITY)
Admission: RE | Disposition: A | Discharge status: Home / Self Care | Payer: Self-pay | Source: Ambulatory Visit | Attending: Cardiovascular Disease

## 2015-08-11 ENCOUNTER — Encounter (HOSPITAL_COMMUNITY): Payer: Self-pay | Admitting: *Deleted

## 2015-08-11 DIAGNOSIS — E1122 Type 2 diabetes mellitus with diabetic chronic kidney disease: Secondary | ICD-10-CM | POA: Diagnosis not present

## 2015-08-11 DIAGNOSIS — Z794 Long term (current) use of insulin: Secondary | ICD-10-CM | POA: Insufficient documentation

## 2015-08-11 DIAGNOSIS — Z6833 Body mass index (BMI) 33.0-33.9, adult: Secondary | ICD-10-CM | POA: Diagnosis not present

## 2015-08-11 DIAGNOSIS — E663 Overweight: Secondary | ICD-10-CM | POA: Insufficient documentation

## 2015-08-11 DIAGNOSIS — I129 Hypertensive chronic kidney disease with stage 1 through stage 4 chronic kidney disease, or unspecified chronic kidney disease: Secondary | ICD-10-CM | POA: Diagnosis not present

## 2015-08-11 DIAGNOSIS — I2510 Atherosclerotic heart disease of native coronary artery without angina pectoris: Secondary | ICD-10-CM | POA: Diagnosis not present

## 2015-08-11 DIAGNOSIS — Z7982 Long term (current) use of aspirin: Secondary | ICD-10-CM | POA: Insufficient documentation

## 2015-08-11 DIAGNOSIS — N189 Chronic kidney disease, unspecified: Secondary | ICD-10-CM | POA: Insufficient documentation

## 2015-08-11 DIAGNOSIS — Z7984 Long term (current) use of oral hypoglycemic drugs: Secondary | ICD-10-CM | POA: Insufficient documentation

## 2015-08-11 HISTORY — PX: CARDIAC CATHETERIZATION: SHX172

## 2015-08-11 LAB — GLUCOSE, CAPILLARY
GLUCOSE-CAPILLARY: 124 mg/dL — AB (ref 65–99)
GLUCOSE-CAPILLARY: 70 mg/dL (ref 65–99)

## 2015-08-11 SURGERY — LEFT HEART CATH AND CORONARY ANGIOGRAPHY

## 2015-08-11 MED ORDER — LIDOCAINE HCL (PF) 1 % IJ SOLN
INTRAMUSCULAR | Status: DC | PRN
  Administered 2015-08-11: 2 mL

## 2015-08-11 MED ORDER — FENTANYL CITRATE (PF) 100 MCG/2ML IJ SOLN
INTRAMUSCULAR | Status: DC | PRN
  Administered 2015-08-11: 25 ug via INTRAVENOUS

## 2015-08-11 MED ORDER — IOPAMIDOL (ISOVUE-370) INJECTION 76%
INTRAVENOUS | Status: AC
  Filled 2015-08-11: qty 50

## 2015-08-11 MED ORDER — VERAPAMIL HCL 2.5 MG/ML IV SOLN
INTRAVENOUS | Status: DC | PRN
  Administered 2015-08-11: 14:00:00 via INTRA_ARTERIAL

## 2015-08-11 MED ORDER — SODIUM CHLORIDE 0.9% FLUSH: 3.0000 mL | Freq: Two times a day (BID) | INTRAVENOUS | Status: DC

## 2015-08-11 MED ORDER — FENTANYL CITRATE (PF) 100 MCG/2ML IJ SOLN
INTRAMUSCULAR | Status: AC
  Filled 2015-08-11: qty 2

## 2015-08-11 MED ORDER — IOPAMIDOL (ISOVUE-370) INJECTION 76%
INTRAVENOUS | Status: DC | PRN
Start: 2015-08-11 — End: 2015-08-11
  Administered 2015-08-11: 110 mL via INTRA_ARTERIAL

## 2015-08-11 MED ORDER — HEPARIN (PORCINE) IN NACL 2-0.9 UNIT/ML-% IJ SOLN
INTRAMUSCULAR | Status: DC | PRN
  Administered 2015-08-11: 14:00:00

## 2015-08-11 MED ORDER — SODIUM CHLORIDE 0.9 % WEIGHT BASED INFUSION
3.0000 mL/kg/h | INTRAVENOUS | Status: DC
Start: 2015-08-12 — End: 2015-08-11

## 2015-08-11 MED ORDER — SODIUM CHLORIDE 0.9 % WEIGHT BASED INFUSION: 3.0000 mL/kg/h | INTRAVENOUS | Status: DC

## 2015-08-11 MED ORDER — MIDAZOLAM HCL 2 MG/2ML IJ SOLN
INTRAMUSCULAR | Status: DC | PRN
Start: 2015-08-11 — End: 2015-08-11
  Administered 2015-08-11: 2 mg via INTRAVENOUS

## 2015-08-11 MED ORDER — SODIUM CHLORIDE 0.9% FLUSH: 3.0000 mL | INTRAVENOUS | Status: DC | PRN

## 2015-08-11 MED ORDER — HEPARIN SODIUM (PORCINE) 1000 UNIT/ML IJ SOLN
INTRAMUSCULAR | Status: DC | PRN
  Administered 2015-08-11: 5000 [IU] via INTRAVENOUS

## 2015-08-11 MED ORDER — DIAZEPAM 2 MG PO TABS: 2.0000 mg | ORAL_TABLET | Freq: Four times a day (QID) | ORAL | Status: DC | PRN

## 2015-08-11 MED ORDER — IOPAMIDOL (ISOVUE-370) INJECTION 76%
INTRAVENOUS | Status: AC
  Filled 2015-08-11: qty 100

## 2015-08-11 MED ORDER — VERAPAMIL HCL 2.5 MG/ML IV SOLN
INTRAVENOUS | Status: AC
  Filled 2015-08-11: qty 2

## 2015-08-11 MED ORDER — SODIUM CHLORIDE 0.9 % IV SOLN: 250.0000 mL | INTRAVENOUS | Status: DC | PRN

## 2015-08-11 MED ORDER — OXYCODONE-ACETAMINOPHEN 5-325 MG PO TABS: 1.0000 | ORAL_TABLET | ORAL | Status: DC | PRN

## 2015-08-11 MED ORDER — SODIUM CHLORIDE 0.9 % WEIGHT BASED INFUSION: 1.0000 mL/kg/h | INTRAVENOUS | Status: DC

## 2015-08-11 MED ORDER — HEPARIN SODIUM (PORCINE) 1000 UNIT/ML IJ SOLN
INTRAMUSCULAR | Status: AC
  Filled 2015-08-11: qty 1

## 2015-08-11 MED ORDER — HEPARIN (PORCINE) IN NACL 2-0.9 UNIT/ML-% IJ SOLN
INTRAMUSCULAR | Status: AC
  Filled 2015-08-11: qty 1500

## 2015-08-11 MED ORDER — LIDOCAINE HCL (PF) 1 % IJ SOLN
INTRAMUSCULAR | Status: AC
  Filled 2015-08-11: qty 30

## 2015-08-11 MED ORDER — MIDAZOLAM HCL 2 MG/2ML IJ SOLN
INTRAMUSCULAR | Status: AC
  Filled 2015-08-11: qty 2

## 2015-08-11 MED ORDER — ASPIRIN 81 MG PO CHEW: 81.0000 mg | CHEWABLE_TABLET | ORAL | Status: DC

## 2015-08-11 SURGICAL SUPPLY — 11 items
CATH INFINITI 5FR ANG PIGTAIL (CATHETERS) ×2 IMPLANT
CATH LAUNCHER 5F RADR (CATHETERS) ×1 IMPLANT
CATHETER LAUNCHER 5F RADR (CATHETERS) ×3
DEVICE RAD COMP TR BAND LRG (VASCULAR PRODUCTS) ×2 IMPLANT
GLIDESHEATH SLEND SS 6F .021 (SHEATH) ×2 IMPLANT
KIT HEART LEFT (KITS) ×3 IMPLANT
PACK CARDIAC CATHETERIZATION (CUSTOM PROCEDURE TRAY) ×3 IMPLANT
SYR MEDRAD MARK V 150ML (SYRINGE) IMPLANT
TRANSDUCER W/STOPCOCK (MISCELLANEOUS) ×3 IMPLANT
TUBING CIL FLEX 10 FLL-RA (TUBING) ×3 IMPLANT
WIRE SAFE-T 1.5MM-J .035X260CM (WIRE) ×3 IMPLANT

## 2015-08-11 NOTE — Discharge Instructions (Signed)
Radial Site Care °Refer to this sheet in the next few weeks. These instructions provide you with information about caring for yourself after your procedure. Your health care provider may also give you more specific instructions. Your treatment has been planned according to current medical practices, but problems sometimes occur. Call your health care provider if you have any problems or questions after your procedure. °WHAT TO EXPECT AFTER THE PROCEDURE °After your procedure, it is typical to have the following: °· Bruising at the radial site that usually fades within 1-2 weeks. °· Blood collecting in the tissue (hematoma) that may be painful to the touch. It should usually decrease in size and tenderness within 1-2 weeks. °HOME CARE INSTRUCTIONS °· Take medicines only as directed by your health care provider. °· You may shower 24-48 hours after the procedure or as directed by your health care provider. Remove the bandage (dressing) and gently wash the site with plain soap and water. Pat the area dry with a clean towel. Do not rub the site, because this may cause bleeding. °· Do not take baths, swim, or use a hot tub until your health care provider approves. °· Check your insertion site every day for redness, swelling, or drainage. °· Do not apply powder or lotion to the site. °· Do not flex or bend the affected arm for 24 hours or as directed by your health care provider. °· Do not push or pull heavy objects with the affected arm for 24 hours or as directed by your health care provider. °· Do not lift over 10 lb (4.5 kg) for 5 days after your procedure or as directed by your health care provider. °· Ask your health care provider when it is okay to: °¨ Return to work or school. °¨ Resume usual physical activities or sports. °¨ Resume sexual activity. °· Do not drive home if you are discharged the same day as the procedure. Have someone else drive you. °· You may drive 24 hours after the procedure unless otherwise  instructed by your health care provider. °· Do not operate machinery or power tools for 24 hours after the procedure. °· If your procedure was done as an outpatient procedure, which means that you went home the same day as your procedure, a responsible adult should be with you for the first 24 hours after you arrive home. °· Keep all follow-up visits as directed by your health care provider. This is important. °SEEK MEDICAL CARE IF: °· You have a fever. °· You have chills. °· You have increased bleeding from the radial site. Hold pressure on the site. °SEEK IMMEDIATE MEDICAL CARE IF: °· You have unusual pain at the radial site. °· You have redness, warmth, or swelling at the radial site. °· You have drainage (other than a small amount of blood on the dressing) from the radial site. °· The radial site is bleeding, and the bleeding does not stop after 30 minutes of holding steady pressure on the site. °· Your arm or hand becomes pale, cool, tingly, or numb. °  °This information is not intended to replace advice given to you by your health care provider. Make sure you discuss any questions you have with your health care provider. °  °Document Released: 04/15/2010 Document Revised: 04/03/2014 Document Reviewed: 09/29/2013 °Elsevier Interactive Patient Education ©2016 [employer] ° °

## 2015-08-11 NOTE — Interval H&P Note (Signed)
History and Physical Interval Note:  08/11/2015 12:31 PM  [name] Can  has presented today for surgery, with the diagnosis of abnormal mioview / cp  The various methods of treatment have been discussed with the patient and family. After consideration of risks, benefits and other options for treatment, the patient has consented to  Procedure(s): Left Heart Cath and Coronary Angiography (N/A) as a surgical intervention .  The patient's history has been reviewed, patient examined, no change in status, stable for surgery.  I have reviewed the patient's chart and labs.  Questions were answered to the patient's satisfaction.     Charlton HawsPeter Keiland Pickering

## 2015-08-11 NOTE — H&P (View-Only) (Signed)
Patient ID: [name], male   DOB: 01/07/1951, 65 y.o.   MRN: [id number]   65 y.o. seen in [place] 08/15/10 for SSCP. Enzymes negative ECG reviewed and had mild T wave changes inferiorly. Had cardiac CT which was read by Dr Talbot. I reviewed the study on the [employer] 3[employer] portal. High likelyhood of > 70% calcific lesion in mid LAD. Patient has been stable and Rx medically . [facility]. CRF;s include DM on insulin, HTN and elevated lipids. Cath by Dr Cooper in May 2012 wit 60% LAD and negative flow wire. Medical Rx recommended.  Has home health helping check his BS. Has seen eye doctor and no retinopathy. [facility] and [facility] follow his [facility]   [facility] ? With bicycle 5/15 and "ok" Has had RLE edema with ? Negative [facility] at [facility] Taking lasix daily  Has been having more pain last 2 months   Cath 02/2014  With 40% D1 and 40%  Mid LAD no critical lesions and normal EF   Still with occasional chest pain some GI overtones Relief with belching However taking nitro on occasion with relief Causes a headache Imdur added last visit.  [facility] [place]  Had exercise myovue with positive ECG Patient had copy of report on his phone  Indicated "abnromal perfusion but not in a typical coronary distribution"  Long discussion with patient. Last cath 1.5 years ago On good medical Rx Need repeat cath to r/o progression of disease. Last cath from right radial RCA hard To cannulate finally engaged with EZ RAD RCA.   ROS: Denies fever, malais, weight loss, blurry vision, decreased visual acuity, cough, sputum, SOB, hemoptysis, pleuritic pain, palpitaitons, heartburn, abdominal pain, melena, lower extremity edema, claudication, or rash.  All other systems reviewed and negative  General: Affect appropriate Overweight black male  HEENT: normal Neck supple with no adenopathy JVP normal no bruits no thyromegaly Lungs clear with no  wheezing and good diaphragmatic motion Heart:  S1/S2 SEM  murmur, no rub, gallop or click PMI normal Abdomen: benighn, BS positve, no tenderness, no AAA no bruit.  No HSM or HJR Distal pulses intact with no bruits No edema Neuro non-focal Skin warm and dry No muscular weakness   Current Outpatient Prescriptions  Medication Sig Dispense Refill  . amitriptyline (ELAVIL) 25 MG tablet Take 25 mg by mouth at bedtime.      . amLODipine (NORVASC) 10 MG tablet Take 10 mg by mouth daily.    . aspirin EC 81 MG tablet Take 81 mg by mouth daily.    . atorvastatin (LIPITOR) 20 MG tablet Take 20 mg by mouth daily.    . cloNIDine (CATAPRES) 0.2 MG tablet Take 0.2 mg by mouth 3 (three) times daily.    . clotrimazole (LOTRIMIN) 1 % cream Apply 1 application topically daily as needed (for athletes feet).     . cyanocobalamin 500 MCG tablet Take 1,000 mcg by mouth daily.     . dicyclomine (BENTYL) 10 MG capsule Take 10 mg by mouth 2 (two) times daily.     . ferrous sulfate 325 (65 FE) MG tablet Take 325 mg by mouth 2 (two) times daily.    . gabapentin (NEURONTIN) 300 MG capsule Take 300 mg by mouth 3 (three) times daily.    . hydrALAZINE (APRESOLINE) 100 MG tablet Take 100 mg by mouth 3 (three) times daily.    . hydroxypropyl   methylcellulose (ISOPTO TEARS) 2.5 % ophthalmic solution Place 1-2 drops into both eyes as needed for dry eyes.     Marland Kitchen. ibuprofen (ADVIL,MOTRIN) 400 MG tablet Take 400 mg by mouth every 6 (six) hours as needed for pain.    Marland Kitchen. insulin aspart (NOVOLOG) 100 UNIT/ML injection Inject 12 Units into the skin 3 (three) times daily.     . insulin glargine (LANTUS) 100 UNIT/ML injection Inject 50 Units into the skin at bedtime.    . isosorbide mononitrate (IMDUR) 30 MG 24 hr tablet Take 0.5 tablets (15 mg total) by mouth daily. 45 tablet 3  . lisinopril (PRINIVIL,ZESTRIL) 40 MG tablet Take 40 mg by mouth daily.      . metFORMIN (GLUMETZA) 1000 MG (MOD) 24 hr tablet Take 1,000 mg by mouth 2 (two)  times daily with a meal.     . metoprolol (LOPRESSOR) 50 MG tablet Take 50 mg by mouth 2 (two) times daily.    Marland Kitchen. omeprazole (PRILOSEC) 20 MG capsule Take 40 mg by mouth daily.     . sertraline (ZOLOFT) 100 MG tablet Take 50 mg by mouth daily.    . Vitamin D, Cholecalciferol, 1000 UNITS TABS Take 2,000 Units by mouth daily.     No current facility-administered medications for this visit.   Facility-Administered Medications Ordered in Other Visits  Medication Dose Route Frequency Provider Last Rate Last Dose  . potassium chloride (K-DUR) CR tablet 40 mEq  40 mEq Oral Once Wendall StadePeter C Gloria Lambertson, MD        Allergies  Atenolol; Gemfibrozil; Simvastatin; and Lac bovis  Electrocardiogram:  02/26/14   NSR inferolateral T wave changes  05/04/15  SR rate 79  Chronic inferolateral T wave changes   Assessment and Plan CAD:  Non obstructive by cath 02/2014  Recurrent pain despite good medical Rx [facility]  Will get Official report. Discussed with patient need for repeat cath. Risks including stroke, bleeding MI and emergency surgery discussed  Willing to proceed Lab called scheduled at 1:00 5/17 Orders written   DM:  Discussed low carb diet.  Target hemoglobin A1c is 6.5 or less.  Continue current medications. HTN:  Well controlled.  Continue current medications and low sodium Dash type diet.    F/u next available  Will have pre cath labs today   Charlton HawsPeter Raven Furnas

## 2015-08-11 NOTE — CV Procedure (Addendum)
See cath note in Epic During this procedure the patient is administered a total of Versed 2 mg and Fentanyl 25 mg to achieve and maintain moderate conscious sedation.  The patient's heart rate, blood pressure, and oxygen saturation are monitored continuously during the procedure. The period of conscious sedation is 20 minutes, of which I was present face-to-face 100% of this time.

## 2015-08-12 ENCOUNTER — Encounter (HOSPITAL_COMMUNITY): Payer: Self-pay | Admitting: Cardiovascular Disease

## 2015-09-03 ENCOUNTER — Telehealth: Payer: Self-pay | Admitting: Cardiovascular Disease

## 2015-09-03 NOTE — Patient Instructions (Signed)
Heart Failure  Heart failure means your heart has trouble pumping blood. This makes it hard for your body to work well. Heart failure is usually a long-term (chronic) condition. You must take good care of yourself and follow your doctor's treatment plan.  HOME CARE   Take your heart medicine as told by your doctor.    Do not stop taking medicine unless your doctor tells you to.    Do not skip any dose of medicine.    Refill your medicines before they run out.    Take other medicines only as told by your doctor or pharmacist.   Stay active if told by your doctor. The elderly and people with severe heart failure should talk with a doctor about physical activity.   Eat heart-healthy foods. Choose foods that are without trans fat and are low in saturated fat, cholesterol, and salt (sodium). This includes fresh or frozen fruits and vegetables, fish, lean meats, fat-free or low-fat dairy foods, whole grains, and high-fiber foods. Lentils and dried peas and beans (legumes) are also good choices.   Limit salt if told by your doctor.   Cook in a healthy way. Roast, grill, broil, bake, poach, steam, or stir-fry foods.   Limit fluids as told by your doctor.   Weigh yourself every morning. Do this after you pee (urinate) and before you eat breakfast. Write down your weight to give to your doctor.   Take your blood pressure and write it down if your doctor tells you to.   Ask your doctor how to check your pulse. Check your pulse as told.   Lose weight if told by your doctor.   Stop smoking or chewing tobacco. Do not use gum or patches that help you quit without your doctor's approval.   Schedule and go to doctor visits as told.   Nonpregnant women should have no more than 1 drink a day. Men should have no more than 2 drinks a day. Talk to your doctor about drinking alcohol.   Stop illegal drug use.   Stay current with shots (immunizations).   Manage your health conditions as told by your doctor.   Learn to  manage your stress.   Rest when you are tired.   If it is really hot outside:    Avoid intense activities.    Use air conditioning or fans, or get in a cooler place.    Avoid caffeine and alcohol.    Wear loose-fitting, lightweight, and light-colored clothing.   If it is really cold outside:    Avoid intense activities.    Layer your clothing.    Wear mittens or gloves, a hat, and a scarf when going outside.    Avoid alcohol.   Learn about heart failure and get support as needed.   Get help to maintain or improve your quality of life and your ability to care for yourself as needed.  GET HELP IF:    You gain weight quickly.   You are more short of breath than usual.   You cannot do your normal activities.   You tire easily.   You cough more than normal, especially with activity.   You have any or more puffiness (swelling) in areas such as your hands, feet, ankles, or belly (abdomen).   You cannot sleep because it is hard to breathe.   You feel like your heart is beating fast (palpitations).   You get dizzy or light-headed when you stand up.  GET HELP   RIGHT AWAY IF:    You have trouble breathing.   There is a change in mental status, such as becoming less alert or not being able to focus.   You have chest pain or discomfort.   You faint.  MAKE SURE YOU:    Understand these instructions.   Will watch your condition.   Will get help right away if you are not doing well or get worse.     This information is not intended to replace advice given to you by your health care provider. Make sure you discuss any questions you have with your health care provider.     Document Released: 12/21/2007 Document Revised: 04/03/2014 Document Reviewed: 04/29/2012  Elsevier Interactive Patient Education 2016 [employer]

## 2015-09-03 NOTE — Telephone Encounter (Signed)
New message      What are the symptoms of congested heart failure?

## 2015-09-03 NOTE — Telephone Encounter (Signed)
Informed patient symptoms of CHF- SOB, swelling, etc. Informed patient that a lot of CHF patients are on diuretics, eat a low sodium diet, might be on fluid restrictions, and weigh daily to keep track of fluid overload. Asked patient if someone had told him he has CHF, and patient stated no he was just wanting to know. Patient stated if he had any other question, he would call back on Monday.  Heart Failure Heart failure means your heart has trouble pumping blood. This makes it hard for your body to work well. Heart failure is usually a long-term (chronic) condition. You must take good care of yourself and follow your doctor's treatment plan. HOME CARE  Take your heart medicine as told by your doctor.  Do not stop taking medicine unless your doctor tells you to.  Do not skip any dose of medicine.  Refill your medicines before they run out.  Take other medicines only as told by your doctor or pharmacist.  Stay active if told by your doctor. The elderly and people with severe heart failure should talk with a doctor about physical activity.  Eat heart-healthy foods. Choose foods that are without trans fat and are low in saturated fat, cholesterol, and salt (sodium). This includes fresh or frozen fruits and vegetables, fish, lean meats, fat-free or low-fat dairy foods, whole grains, and high-fiber foods. Lentils and dried peas and beans (legumes) are also good choices.  Limit salt if told by your doctor.  Cook in a healthy way. Roast, grill, broil, bake, poach, steam, or stir-fry foods.  Limit fluids as told by your doctor.  Weigh yourself every morning. Do this after you pee (urinate) and before you eat breakfast. Write down your weight to give to your doctor.  Take your blood pressure and write it down if your doctor tells you to.  Ask your doctor how to check your pulse. Check your pulse as told.  Lose weight if told by your doctor.  Stop smoking or chewing tobacco. Do not use gum or  patches that help you quit without your doctor's approval.  Schedule and go to doctor visits as told.  Nonpregnant women should have no more than 1 drink a day. Men should have no more than 2 drinks a day. Talk to your doctor about drinking alcohol.  Stop illegal drug use.  Stay current with shots (immunizations).  Manage your health conditions as told by your doctor.  Learn to manage your stress.  Rest when you are tired.  If it is really hot outside:  Avoid intense activities.  Use air conditioning or fans, or get in a cooler place.  Avoid caffeine and alcohol.  Wear loose-fitting, lightweight, and light-colored clothing.  If it is really cold outside:  Avoid intense activities.  Layer your clothing.  Wear mittens or gloves, a hat, and a scarf when going outside.  Avoid alcohol.  Learn about heart failure and get support as needed.  Get help to maintain or improve your quality of life and your ability to care for yourself as needed. GET HELP IF:   You gain weight quickly.  You are more short of breath than usual.  You cannot do your normal activities.  You tire easily.  You cough more than normal, especially with activity.  You have any or more puffiness (swelling) in areas such as your hands, feet, ankles, or belly (abdomen).  You cannot sleep because it is hard to breathe.  You feel like your heart is beating  fast (palpitations).  You get dizzy or light-headed when you stand up. GET HELP RIGHT AWAY IF:   You have trouble breathing.  There is a change in mental status, such as becoming less alert or not being able to focus.  You have chest pain or discomfort.  You faint. MAKE SURE YOU:   Understand these instructions.  Will watch your condition.  Will get help right away if you are not doing well or get worse.   This information is not intended to replace advice given to you by your health care provider. Make sure you discuss any questions  you have with your health care provider.

## 2016-01-04 IMAGING — CR DG CHEST 2V
2 series · 2 of 2 positions shown · non-contrast
Comparison: CT 08/01/2010.  Chest x-ray 08/15/2010.

CLINICAL DATA: Coronary artery disease.

EXAM:
CHEST  2 VIEW

[view not recorded (1 of 2)]
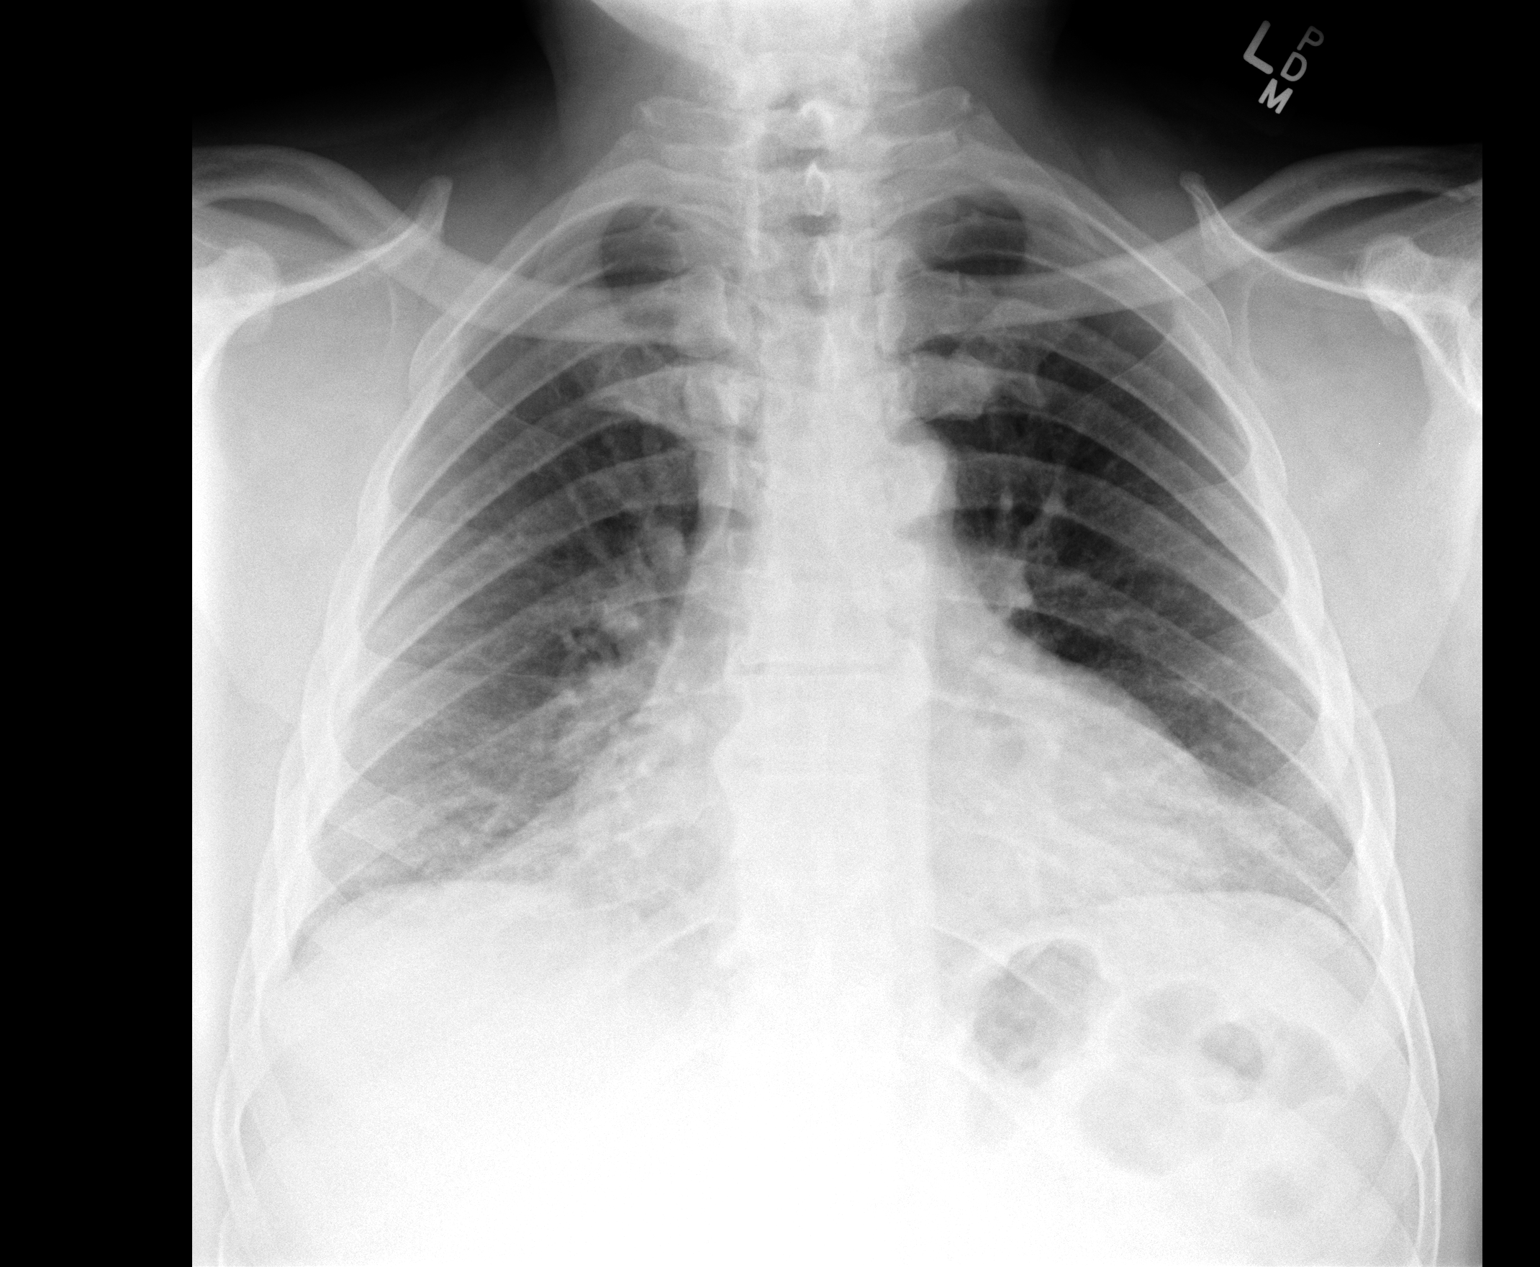

[view not recorded (2 of 2)]
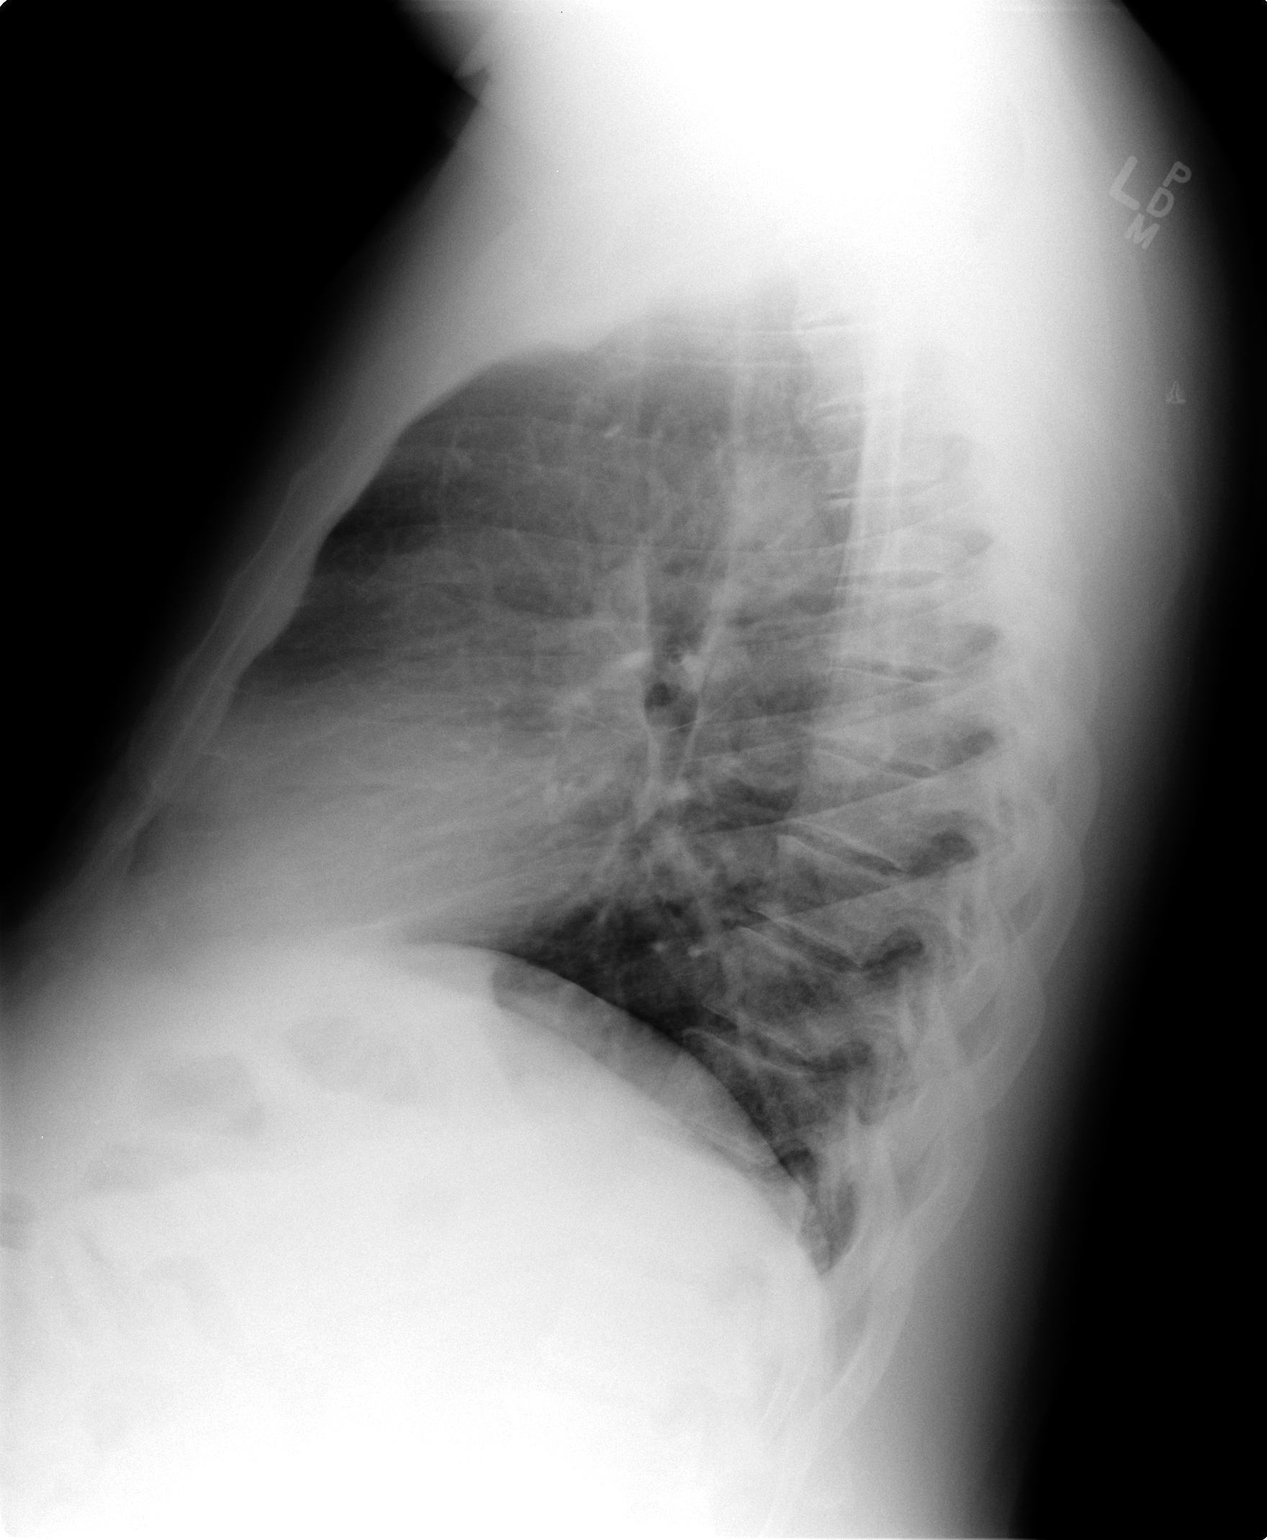

[2 of 2 positions shown; findings below may reference images not displayed]

FINDINGS: Mediastinum and hilar structures are normal. Cardiomegaly with
normal pulmonary vascularity. No pleural effusion or pneumothorax.
Degenerative changes thoracic spine.
IMPRESSION: 1. Cardiomegaly, no CHF.
2. No acute pulmonary disease.

## 2017-01-03 ENCOUNTER — Telehealth: Payer: Self-pay | Admitting: Cardiovascular Disease

## 2017-01-03 NOTE — Telephone Encounter (Signed)
New message   Patient calling C/o feeling on left side arm & neck going up to brain numbness effect .

## 2017-01-03 NOTE — Telephone Encounter (Addendum)
Pt is simply calling in to schedule his overdue appt with Dr Eden Emms.  Pt reports that for over 2 months now, he has been having left sided numbness in his left arm and face, which he reports as "my vessels going bad." Pt states that he has been closely followed by the [facility] hospital, and has had numerous test done to further evaluate this issue.   [facility] states that [facility], and he wants to see Dr Eden Emms, because this is "affecting my psyche." Pt reports he is in no acute distress at this time.  Pt reports that [facility] with the [facility] on next [facility], and will request his records and test done, so he can have this available when Dr Eden Emms see's him.  Informed the pt that I will route this message to Dr Eden Emms and his RN for further review and follow-up with the pt.  Informed the pt that Dr Fabio Bering RN will follow-up with him and assist on scheduling him a follow-up appt.  Pt verbalized understanding and agrees with this plan.

## 2017-01-04 NOTE — Telephone Encounter (Signed)
Patient sounds like he needs to go to ED, with slurred speech, etc. Patient stated he doesn't want to go to the ED. Patient stated he would like to be seen sooner than later. Patient is complaining of tingling that goes up his arm and up into his neck. Patient is due for yearly f/u. Patient stated he could not come in until after 10/16, due to appts at the [place]. Patient stated his doctors at the [place] are aware of his symptoms. Patient stated the [facility] has not told him anything. Made patient an appointment next Wednesday with the first available PA. Informed patient that he would need to go to ED if symptoms got worse. Patient verbalized understanding.

## 2017-01-04 NOTE — Telephone Encounter (Signed)
Left message for patient to call back  

## 2017-01-04 NOTE — Telephone Encounter (Signed)
Follow up  ° ° ° °Pt is returning call to RN. °

## 2017-01-10 ENCOUNTER — Ambulatory Visit: Payer: Medicare Other | Admitting: Cardiology

## 2017-02-01 ENCOUNTER — Ambulatory Visit: Payer: Medicare Other | Admitting: Cardiology

## 2017-03-23 NOTE — Progress Notes (Signed)
Cardiology Office Note   Date:  04/02/2017   ID:  [name], DOB [birth year], MRN [id number]  PCP:  System, Pcp Not In  Cardiologist:   Charlton HawsPeter Nishan, MD   No chief complaint on file.     History of Present Illness: Aaron GillisRuffin Schrade is a 66 y.o. male who presents for f/u of HTN, IDDM, and CAD. Last cath 08/11/15 reviewed and only 45-50% mid LAD Lesion which was similar to cath in 2012 with negative flow wire. [facility].called office in October with left sided numbness in arm and face. Apparently w[facility] past [facility] but no diagnosis and patient indicated "affecting my psyche. Advised to go to ER for possible TIA symptoms but he did not   [facility]   No chest pain mild exertional dyspnea from obesity Has an older lab mix dog that he no longer walks     Past Medical History:  Diagnosis Date  . CAD (coronary artery disease)   . Chest pain   . HTN (hypertension)   . IDDM (insulin dependent diabetes mellitus) (HCC)   . SOB (shortness of breath)     Past Surgical History:  Procedure Laterality Date  . CARDIAC CATHETERIZATION N/A 08/11/2015   Procedure: Left Heart Cath and Coronary Angiography;  Surgeon: Wendall StadePeter C Nishan, MD;  [facility];  Service: Cardiovascular;  Laterality: N/A;  . LEFT HEART CATHETERIZATION WITH CORONARY ANGIOGRAM N/A 02/27/2014   Procedure: LEFT HEART CATHETERIZATION WITH CORONARY ANGIOGRAM;  Surgeon: Wendall StadePeter C Nishan, MD;  [facility];  Service: Cardiovascular;  Laterality: N/A;     Current Outpatient Medications  Medication Sig Dispense Refill  . amitriptyline (ELAVIL) 25 MG tablet Take 25 mg by mouth at bedtime.      Marland Kitchen. amLODipine (NORVASC) 10 MG tablet Take 10 mg by mouth daily.    Marland Kitchen. aspirin EC 81 MG tablet Take 81 mg by mouth daily.    Marland Kitchen. atorvastatin (LIPITOR) 20 MG tablet Take 20 mg by mouth daily.    . cloNIDine (CATAPRES) 0.2 MG tablet Take 0.2 mg by mouth  3 (three) times daily.    . clotrimazole (LOTRIMIN) 1 % cream Apply 1 application topically daily as needed (for athletes feet).     . cyanocobalamin 500 MCG tablet Take 1,000 mcg by mouth daily.     Marland Kitchen. dicyclomine (BENTYL) 10 MG capsule Take 10 mg by mouth 2 (two) times daily.     . ferrous sulfate 325 (65 FE) MG tablet Take 325 mg by mouth 2 (two) times daily.    Marland Kitchen. gabapentin (NEURONTIN) 300 MG capsule Take 300 mg by mouth 3 (three) times daily.    . hydrALAZINE (APRESOLINE) 100 MG tablet Take 100 mg by mouth 3 (three) times daily.    . hydroxypropyl methylcellulose (ISOPTO TEARS) 2.5 % ophthalmic solution Place 1-2 drops into both eyes as needed for dry eyes.     Marland Kitchen. ibuprofen (ADVIL,MOTRIN) 400 MG tablet Take 400 mg by mouth every 6 (six) hours as needed for pain.    Marland Kitchen. insulin aspart (NOVOLOG) 100 UNIT/ML injection Inject 12 Units into the skin 3 (three) times daily.     . insulin glargine (LANTUS) 100 UNIT/ML injection Inject 50 Units into the skin at bedtime.    . isosorbide mononitrate (IMDUR) 30 MG 24 hr tablet Take 0.5 tablets (15 mg total) by mouth daily. 45 tablet 3  .  lisinopril (PRINIVIL,ZESTRIL) 40 MG tablet Take 40 mg by mouth daily.      . metFORMIN (GLUMETZA) 1000 MG (MOD) 24 hr tablet Take 1,000 mg by mouth 2 (two) times daily with a meal.     . metoprolol (LOPRESSOR) 50 MG tablet Take 50 mg by mouth 2 (two) times daily.    Marland Kitchen omeprazole (PRILOSEC) 20 MG capsule Take 40 mg by mouth daily.     . sertraline (ZOLOFT) 100 MG tablet Take 50 mg by mouth daily.    . Vitamin D, Cholecalciferol, 1000 UNITS TABS Take 2,000 Units by mouth daily.     No current facility-administered medications for this visit.    Facility-Administered Medications Ordered in Other Visits  Medication Dose Route Frequency Provider Last Rate Last Dose  . potassium chloride (K-DUR) CR tablet 40 mEq  40 mEq Oral Once Wendall Stade, MD        Allergies:   Atenolol; Gemfibrozil; Simvastatin; and Lac bovis     Social History:  The patient  reports that he quit smoking about 34 years ago. he has never used smokeless tobacco. He reports that he does not drink alcohol or use drugs.   Family History:  The patient's family history includes Diabetes in his mother.    ROS:  Please see the history of present illness.   Otherwise, review of systems are positive for none.   All other systems are reviewed and negative.    PHYSICAL EXAM: VS:  BP (!) 142/72   Pulse 84   Ht 5\' 9"  (1.753 m)   Wt 225 lb (102.1 kg)   SpO2 95%   BMI 33.23 kg/m  , BMI Body mass index is 33.23 kg/m. Affect appropriate Obese black male  HEENT: normal Neck supple with no adenopathy JVP normal no bruits no thyromegaly Lungs clear with no wheezing and good diaphragmatic motion Heart:  S1/S2 no murmur, no rub, gallop or click PMI normal Abdomen: benighn, BS positve, no tenderness, no AAA ventral hernia  no bruit.  No HSM or HJR Distal pulses intact with no bruits No edema Neuro non-focal Skin warm and dry No muscular weakness    EKG:  08/11/15 SR normal 04/02/17 SR rate 80 QT 426    Recent Labs: No results found for requested labs within last 8760 hours.    Lipid Panel No results found for: CHOL, TRIG, HDL, CHOLHDL, VLDL, LDLCALC, LDLDIRECT    Wt Readings from Last 3 Encounters:  04/02/17 225 lb (102.1 kg)  08/11/15 224 lb (101.6 kg)  08/05/15 224 lb 1.9 oz (101.7 kg)      Other studies Reviewed: Additional studies/ records that were reviewed today include: cath 2012 and 2017.    ASSESSMENT AND PLAN:  1.  CAD:  Non obstructive by cath 2017 continue risk factor modification stable 2. HTN:  Well controlled.  Continue current medications and low sodium Dash type diet.   3. DM:  Discussed low carb diet.  Target hemoglobin A1c is 6.5 or less.  Continue current medications. 4. Cholesterol:  [facility] 5. Paresthesias:  F/U [facility] CT head and C spine ok    Current medicines are reviewed at  length with the patient today.  The patient does not have concerns regarding medicines.  The following changes have been made:  no change  Labs/ tests ordered today include: [facility] head [facility] spine [facility]   Orders Placed This Encounter  Procedures  . EKG 12-Lead     Disposition:  FU with cardiology in a year      Signed, Charlton Hawseter Nishan, MD  04/02/2017 9:41 AM    [facility] [address], [place], [place]  [address] Phone: [phone]; Fax: [phone]

## 2017-04-02 ENCOUNTER — Encounter: Payer: Self-pay | Admitting: Cardiovascular Disease

## 2017-04-02 ENCOUNTER — Encounter (INDEPENDENT_AMBULATORY_CARE_PROVIDER_SITE_OTHER): Payer: Self-pay

## 2017-04-02 ENCOUNTER — Ambulatory Visit (INDEPENDENT_AMBULATORY_CARE_PROVIDER_SITE_OTHER): Payer: Medicare Other | Admitting: Cardiovascular Disease

## 2017-04-02 VITALS — BP 142/72 | HR 84 | Ht 69.0 in | Wt 225.0 lb

## 2017-04-02 DIAGNOSIS — I2510 Atherosclerotic heart disease of native coronary artery without angina pectoris: Secondary | ICD-10-CM

## 2017-04-02 DIAGNOSIS — I10 Essential (primary) hypertension: Secondary | ICD-10-CM | POA: Diagnosis not present

## 2017-04-02 NOTE — Patient Instructions (Signed)

## 2018-04-01 NOTE — Progress Notes (Signed)
Cardiology Office Note   Date:  04/02/2018   ID:  [name], DOB 06/11/1950, MRN [id number]  PCP:  System, Pcp Not In  Cardiologist:   Charlton Haws, MD   No chief complaint on file.     History of Present Illness:  68 y.o. 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 Some muscular pain in left arm.  Does not like care he gets at [facility] had carotids checked at [facility] and 50% stenosis   Past Medical History:  Diagnosis Date  . CAD (coronary artery disease)   . Chest pain   . HTN (hypertension)   . IDDM (insulin dependent diabetes mellitus) (HCC)   . SOB (shortness of breath)     Past Surgical History:  Procedure Laterality Date  . CARDIAC CATHETERIZATION N/A 08/11/2015   Procedure: Left Heart Cath and Coronary Angiography;  Surgeon: Wendall Stade, MD;  Location: [facility] INVASIVE CV LAB;  Service: Cardiovascular;  Laterality: N/A;  . LEFT HEART CATHETERIZATION WITH CORONARY ANGIOGRAM N/A 02/27/2014   Procedure: LEFT HEART CATHETERIZATION WITH CORONARY ANGIOGRAM;  Surgeon: Wendall Stade, MD;  Location: [facility] CATH LAB;  Service: Cardiovascular;  Laterality: N/A;     Current Outpatient Medications  Medication Sig Dispense Refill  . amitriptyline (ELAVIL) 25 MG tablet Take 25 mg by mouth at bedtime.      Marland Kitchen amLODipine (NORVASC) 10 MG tablet Take 10 mg by mouth daily.    Marland Kitchen aspirin EC 81 MG tablet Take 81 mg by mouth daily.    Marland Kitchen atorvastatin (LIPITOR) 20 MG tablet Take 20 mg by mouth daily.    . cloNIDine (CATAPRES) 0.2 MG tablet Take 0.2 mg by mouth 3 (three) times daily.    . clotrimazole (LOTRIMIN) 1 % cream Apply 1 application topically daily as needed (for athletes  feet).     . cyanocobalamin 500 MCG tablet Take 1,000 mcg by mouth daily.     Marland Kitchen dicyclomine (BENTYL) 10 MG capsule Take 10 mg by mouth 2 (two) times daily.     . ferrous sulfate 325 (65 FE) MG tablet Take 325 mg by mouth 2 (two) times daily.    Marland Kitchen gabapentin (NEURONTIN) 300 MG capsule Take 300 mg by mouth 3 (three) times daily.    . hydrALAZINE (APRESOLINE) 100 MG tablet Take 100 mg by mouth 3 (three) times daily.    . hydroxypropyl methylcellulose (ISOPTO TEARS) 2.5 % ophthalmic solution Place 1-2 drops into both eyes as needed for dry eyes.     Marland Kitchen ibuprofen (ADVIL,MOTRIN) 400 MG tablet Take 400 mg by mouth every 6 (six) hours as needed for pain.    Marland Kitchen insulin aspart (NOVOLOG) 100 UNIT/ML injection Inject 12 Units into the skin 3 (three) times daily.     . insulin glargine (LANTUS) 100 UNIT/ML injection Inject 50 Units into the skin at bedtime.    . isosorbide mononitrate (IMDUR) 30 MG 24 hr tablet Take 0.5 tablets (15 mg total) by mouth daily. 45 tablet 3  . lisinopril (PRINIVIL,ZESTRIL) 40 MG tablet Take 40 mg by mouth daily.      . metFORMIN (GLUMETZA) 1000 MG (  MOD) 24 hr tablet Take 1,000 mg by mouth 2 (two) times daily with a meal.     . metoprolol (LOPRESSOR) 50 MG tablet Take 50 mg by mouth 2 (two) times daily.    Marland Kitchen omeprazole (PRILOSEC) 20 MG capsule Take 40 mg by mouth daily.     . sertraline (ZOLOFT) 100 MG tablet Take 50 mg by mouth daily.    . Vitamin D, Cholecalciferol, 1000 UNITS TABS Take 2,000 Units by mouth daily.     No current facility-administered medications for this visit.    Facility-Administered Medications Ordered in Other Visits  Medication Dose Route Frequency Provider Last Rate Last Dose  . potassium chloride (K-DUR) CR tablet 40 mEq  40 mEq Oral Once Wendall Stade, MD        Allergies:   Atenolol; Gemfibrozil; Simvastatin; and Lac bovis    Social History:  The patient  reports that he quit smoking about 35 years ago. He has never used smokeless tobacco. He  reports that he does not drink alcohol or use drugs.   Family History:  The patient's family history includes Diabetes in his mother.    ROS:  Please see the history of present illness.   Otherwise, review of systems are positive for none.   All other systems are reviewed and negative.    PHYSICAL EXAM: VS:  BP 138/64   Pulse 68   Ht 5\' 9"  (1.753 m)   Wt 220 lb 1.9 oz (99.8 kg)   SpO2 98%   BMI 32.51 kg/m  , BMI Body mass index is 32.51 kg/m. Affect appropriate Obese black male  HEENT: normal Neck supple with no adenopathy JVP normal no bruits no thyromegaly Lungs clear with no wheezing and good diaphragmatic motion Heart:  S1/S2 no murmur, no rub, gallop or click PMI normal Abdomen: benighn, BS positve, no tenderness, no AAA ventral hernia  no bruit.  No HSM or HJR Distal pulses intact with no bruits No edema Neuro non-focal Skin warm and dry No muscular weakness    EKG:  08/11/15 SR normal 04/02/17 SR rate 80 QT 426  04/02/18 SR rate 68 nonspecific ST changes   Recent Labs: No results found for requested labs within last 8760 hours.    Lipid Panel No results found for: CHOL, TRIG, HDL, CHOLHDL, VLDL, LDLCALC, LDLDIRECT    Wt Readings from Last 3 Encounters:  04/02/18 220 lb 1.9 oz (99.8 kg)  04/02/17 225 lb (102.1 kg)  08/11/15 224 lb (101.6 kg)      Other studies Reviewed: Additional studies/ records that were reviewed today include: cath 2012 and 2017.    ASSESSMENT AND PLAN:  1. CAD:  Non obstructive by cath 2017 continue risk factor modification stable 2. HTN:  Well controlled.  Continue current medications and low sodium Dash type diet.   3. DM:  Discussed low carb diet.  Target hemoglobin A1c is 6.5 or less.  Continue current medications. 4. Ch[facility]:  [facility] 5. Paresthesias:  [facility] head and C spine ok    Current medicines are reviewed at length with the patient today.  The patient does not have concerns regarding  medicines.  The following changes have been made:  no change  Labs/ tests ordered today include  None   Orders Placed This Encounter  Procedures  . EKG 12-Lead     Disposition:   FU with cardiology in a year      Signed, Charlton Haws, MD  04/02/2018 2:58 PM    [facility] [address], [place], [place]  [address] Phone: [phone](336) [phone]; Fax: [phone]) [phone]

## 2018-04-02 ENCOUNTER — Ambulatory Visit (INDEPENDENT_AMBULATORY_CARE_PROVIDER_SITE_OTHER): Payer: Medicare Other | Admitting: Cardiovascular Disease

## 2018-04-02 ENCOUNTER — Encounter: Payer: Self-pay | Admitting: Cardiovascular Disease

## 2018-04-02 VITALS — BP 138/64 | HR 68 | Ht 69.0 in | Wt 220.1 lb

## 2018-04-02 DIAGNOSIS — I10 Essential (primary) hypertension: Secondary | ICD-10-CM | POA: Diagnosis not present

## 2018-04-02 DIAGNOSIS — I2510 Atherosclerotic heart disease of native coronary artery without angina pectoris: Secondary | ICD-10-CM

## 2018-04-02 NOTE — Patient Instructions (Signed)

## 2019-09-10 NOTE — Progress Notes (Signed)
Cardiology Office Note   Date:  09/15/2019   ID:  [name], DOB 1950/04/01, MRN [id number]  PCP:  Desiree Hane, [place]  Cardiologist:   Jenkins Rouge, MD   No chief complaint on file.     History of Present Illness:  69 y.o. 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 Some muscular pain in left arm.  Does not like care he gets at [facility] had carotids checked at [facility] and 50% stenosis   Has a black lab and pittie at home that he walks daily with no chest pain Has not had good control of DM due to dietary indiscretion A1c over 8 Needs new nitro   Past Medical History:  Diagnosis Date  . CAD (coronary artery disease)   . Chest pain   . HTN (hypertension)   . IDDM (insulin dependent diabetes mellitus)   . SOB (shortness of breath)     Past Surgical History:  Procedure Laterality Date  . CARDIAC CATHETERIZATION N/A 08/11/2015   Procedure: Left Heart Cath and Coronary Angiography;  Surgeon: [name], MD;  Location: [place] CV LAB;  Service: Cardiovascular;  Laterality: N/A;  . LEFT HEART CATHETERIZATION WITH CORONARY ANGIOGRAM N/A 02/27/2014   Procedure: LEFT HEART CATHETERIZATION WITH CORONARY ANGIOGRAM;  Surgeon: [name], MD;  Location: [facility] CATH LAB;  Service: Cardiovascular;  Laterality: N/A;     Current Outpatient Medications  Medication Sig Dispense Refill  . amitriptyline (ELAVIL) 25 MG tablet Take 25 mg by mouth at bedtime.      Marland Kitchen amLODipine (NORVASC) 10 MG tablet Take 10 mg by mouth daily.    Marland Kitchen aspirin EC 81 MG tablet Take 81 mg by mouth daily.    Marland Kitchen atorvastatin (LIPITOR) 20 MG tablet Take 20 mg by mouth daily.    . cloNIDine (CATAPRES)  0.2 MG tablet Take 0.2 mg by mouth 3 (three) times daily.    . clotrimazole (LOTRIMIN) 1 % cream Apply 1 application topically daily as needed (for athletes feet).     . cyanocobalamin 500 MCG tablet Take 1,000 mcg by mouth daily.     Marland Kitchen dicyclomine (BENTYL) 10 MG capsule Take 10 mg by mouth 2 (two) times daily.     . ferrous sulfate 325 (65 FE) MG tablet Take 325 mg by mouth 2 (two) times daily.    Marland Kitchen gabapentin (NEURONTIN) 300 MG capsule Take 300 mg by mouth 3 (three) times daily.    . hydrALAZINE (APRESOLINE) 100 MG tablet Take 100 mg by mouth 3 (three) times daily.    . hydroxypropyl methylcellulose (ISOPTO TEARS) 2.5 % ophthalmic solution Place 1-2 drops into both eyes as needed for dry eyes.     Marland Kitchen ibuprofen (ADVIL,MOTRIN) 400 MG tablet Take 400 mg by mouth every 6 (six) hours as needed for pain.    Marland Kitchen insulin aspart (NOVOLOG) 100 UNIT/ML injection Inject 12 Units into the skin 3 (three) times daily.     . insulin glargine (LANTUS) 100 UNIT/ML injection Inject 50 Units into the skin at bedtime.    . isosorbide mononitrate (IMDUR) 30 MG 24 hr tablet Take 0.5  tablets (15 mg total) by mouth daily. 45 tablet 3  . lisinopril (PRINIVIL,ZESTRIL) 40 MG tablet Take 40 mg by mouth daily.      . metFORMIN (GLUMETZA) 1000 MG (MOD) 24 hr tablet Take 1,000 mg by mouth 2 (two) times daily with a meal.     . metoprolol (LOPRESSOR) 50 MG tablet Take 50 mg by mouth 2 (two) times daily.    Marland Kitchen omeprazole (PRILOSEC) 20 MG capsule Take 40 mg by mouth daily.     . sertraline (ZOLOFT) 100 MG tablet Take 50 mg by mouth daily.    . Vitamin D, Cholecalciferol, 1000 UNITS TABS Take 2,000 Units by mouth daily.     No current facility-administered medications for this visit.   Facility-Administered Medications Ordered in Other Visits  Medication Dose Route Frequency Provider Last Rate Last Admin  . potassium chloride (K-DUR) CR tablet 40 mEq  40 mEq Oral Once Wendall Stade, MD        Allergies:   Atenolol,  Gemfibrozil, Simvastatin, and Lac bovis    Social History:  The patient  reports that he quit smoking about 36 years ago. He has never used smokeless tobacco. He reports that he does not drink alcohol and does not use drugs.   Family History:  The patient's family history includes Diabetes in his mother.    ROS:  Please see the history of present illness.   Otherwise, review of systems are positive for none.   All other systems are reviewed and negative.    PHYSICAL EXAM: VS:  BP (!) 142/72   Pulse 68   Ht 5\' 9"  (1.753 m)   Wt 215 lb (97.5 kg)   SpO2 98%   BMI 31.75 kg/m  , BMI Body mass index is 31.75 kg/m. Affect appropriate Obese black male  HEENT: normal Neck supple with no adenopathy JVP normal no bruits no thyromegaly Lungs clear with no wheezing and good diaphragmatic motion Heart:  S1/S2 no murmur, no rub, gallop or click PMI normal Abdomen: benighn, BS positve, no tenderness, no AAA ventral hernia  no bruit.  No HSM or HJR Distal pulses intact with no bruits No edema Neuro non-focal Skin warm and dry No muscular weakness    EKG:  09/15/19 SR rate 68 non specific ST changes no change from 2017   Recent Labs: No results found for requested labs within last 8760 hours.    Lipid Panel No results found for: CHOL, TRIG, HDL, CHOLHDL, VLDL, LDLCALC, LDLDIRECT    Wt Readings from Last 3 Encounters:  09/15/19 215 lb (97.5 kg)  04/02/18 220 lb 1.9 oz (99.8 kg)  04/02/17 225 lb (102.1 kg)      Other studies Reviewed: Additional studies/ records that were reviewed today include: cath 2012 and 2017.    ASSESSMENT AND PLAN:  1. CAD:  Non obstructive by cath 2017 continue risk factor modification  New nitro needed he gets it free from 2018 2. HTN:  Well controlled.  Continue current medications and low sodium Dash type diet.   3. DM:  Discussed low carb diet.  Target hemoglobin A1c is 6.5 or less.  Continue current medications. 4. Cholesterol:  [facility] 5. Paresthesias:  F/U [facility] CT head and C spine ok    Current medicines are reviewed at length with the patient today.  The patient does not have concerns regarding medicines.  The following changes have been made:  no change  Labs/ tests ordered today  include  None   No orders of the defined types were placed in this encounter.    Disposition:   FU with cardiology in a year      Signed, Charlton Haws, MD  09/15/2019 4:32 PM    [facility] [address] [place], [place], [place]  [address] Phone: [phone]; Fax: [phone]

## 2019-09-15 ENCOUNTER — Encounter: Payer: Self-pay | Admitting: Cardiovascular Disease

## 2019-09-15 ENCOUNTER — Ambulatory Visit (INDEPENDENT_AMBULATORY_CARE_PROVIDER_SITE_OTHER): Payer: Medicare Other | Admitting: Cardiovascular Disease

## 2019-09-15 ENCOUNTER — Encounter (INDEPENDENT_AMBULATORY_CARE_PROVIDER_SITE_OTHER): Payer: Self-pay

## 2019-09-15 ENCOUNTER — Other Ambulatory Visit: Payer: Self-pay

## 2019-09-15 VITALS — BP 142/72 | HR 68 | Ht 69.0 in | Wt 215.0 lb

## 2019-09-15 DIAGNOSIS — I2510 Atherosclerotic heart disease of native coronary artery without angina pectoris: Secondary | ICD-10-CM

## 2019-09-15 DIAGNOSIS — I10 Essential (primary) hypertension: Secondary | ICD-10-CM | POA: Diagnosis not present

## 2019-09-15 NOTE — Patient Instructions (Signed)
Medication Instructions:  *If you need a refill on your cardiac medications before your next appointment, please call your pharmacy*  Lab Work: If you have labs (blood work) drawn today and your tests are completely normal, you will receive your results only by: . MyChart Message (if you have MyChart) OR . A paper copy in the mail If you have any lab test that is abnormal or we need to change your treatment, we will call you to review the results.  Follow-Up: At [facility], you and your health needs are our priority.  As part of our continuing mission to provide you with exceptional heart care, we have created designated Provider Care Teams.  These Care Teams include your primary Cardiologist (physician) and Advanced Practice Providers (APPs -  Physician Assistants and Nurse Practitioners) who all work together to provide you with the care you need, when you need it.  We recommend signing up for the patient portal called "MyChart".  Sign up information is provided on this After Visit Summary.  MyChart is used to connect with patients for Virtual Visits (Telemedicine).  Patients are able to view lab/test results, encounter notes, upcoming appointments, etc.  Non-urgent messages can be sent to your provider as well.   To learn more about what you can do with MyChart, go to [email or web address].    Your next appointment:   1 year(s)  The format for your next appointment:   In Person  Provider:   You may see Dr. Nishan or one of the following Advanced Practice Providers on your designated Care Team:    Lori Gerhardt, NP  Laura Ingold, NP  Jill McDaniel, NP     

## 2021-07-21 NOTE — H&P (View-Only) (Signed)
?  ?Cardiology Office Note ? ? ?Date:  07/25/2021  ? ?ID:  [name], DOB 11/29/1950, MRN [id number] ? ?PCP:  [name], [name], PA  ?Cardiologist:   [name], MD  ? ? ?History of Present Illness: ? ?71 y.o. f/u HTN, IDDM and known moderate CAD Last cath May 2017 50% mid LAD disease with negative flow-wire  [facility]Some neuropathic pain in left face/arm  ? ?Ind[facility] and [facility]% [facility]  ? ?Has a black lab and pittie at home that he walks daily with no chest pain Has not had good control of DM due to dietary indiscretion A1c over 8 ? ?April [facility] Read report and most consistent with breast/soft tissue attenuation low risk with normal EF He has had very atypical chest pain sharp lasting seconds and non exertional He Has some CRF I don't know his Cr ? ?Shared decision making given atypical nature of pain and CRF discussed options Do not want to do full CTA/Cath. Start with calcium score and if very high may consider PET/CT or contrast study f/u depending on his Cr We will check labs today  ? ? ?Past Medical History:  ?Diagnosis Date  ? CAD (coronary artery disease)   ? Chest pain   ? HTN (hypertension)   ? IDDM (insulin dependent diabetes mellitus)   ? SOB (shortness of breath)   ? ? ?Past Surgical History:  ?Procedure Laterality Date  ? CARDIAC CATHETERIZATION N/A 08/11/2015  ? Procedure: Left Heart Cath and Coronary Angiography;  Surgeon: Aaron Divis C Koren Sermersheim, MD;  Location: [facility] INVASIVE CV LAB;  Service: Cardiovascular;  Laterality: N/A;  ? LEFT HEART CATHETERIZATION WITH CORONARY ANGIOGRAM N/A 02/27/2014  ? Procedure: LEFT HEART CATHETERIZATION WITH CORONARY ANGIOGRAM;  Surgeon: Aaron Oatis C Duncan Alejandro, MD;  Location: [facility] CATH LAB;  Service: Cardiovascular;  Laterality: N/A;  ? ? ? ?Current Outpatient Medications  ?Medication Sig Dispense Refill  ? amitriptyline (ELAVIL) 25 MG tablet Take 25 mg by mouth at bedtime.      ? amLODipine (NORVASC) 10 MG tablet Take 10 mg by  mouth daily.    ? aspirin EC 81 MG tablet Take 81 mg by mouth daily.    ? atorvastatin (LIPITOR) 20 MG tablet Take 20 mg by mouth daily.    ? cloNIDine (CATAPRES) 0.2 MG tablet Take 0.2 mg by mouth 3 (three) times daily.    ? clotrimazole (LOTRIMIN) 1 % cream Apply 1 application topically daily as needed (for athletes feet).     ? cyanocobalamin 500 MCG tablet Take 1,000 mcg by mouth daily.     ? dicyclomine (BENTYL) 10 MG capsule Take 10 mg by mouth 2 (two) times daily.     ? ferrous sulfate 325 (65 FE) MG tablet Take 325 mg by mouth 2 (two) times daily.    ? gabapentin (NEURONTIN) 300 MG capsule Take 300 mg by mouth 3 (three) times daily.    ? hydrALAZINE (APRESOLINE) 100 MG tablet Take 100 mg by mouth 3 (three) times daily.    ? hydroxypropyl methylcellulose (ISOPTO TEARS) 2.5 % ophthalmic solution Place 1-2 drops into both eyes as needed for dry eyes.     ? ibuprofen (ADVIL,MOTRIN) 400 MG tablet Take 400 mg by mouth every 6 (six) hours as needed for pain.    ? insulin aspart (NOVOLOG) 100 UNIT/ML injection Inject into the skin 2 (two) times daily. Take 12 units in the am and 17 units in   the PM     isosorbide mononitrate (IMDUR) 30 MG 24 hr tablet Take 0.5 tablets (15 mg total) by mouth daily. 45 tablet 3   lisinopril (PRINIVIL,ZESTRIL) 40 MG tablet Take 40 mg by mouth daily.       metFORMIN (GLUMETZA) 1000 MG (MOD) 24 hr tablet Take 500 mg by mouth daily.     metoprolol (LOPRESSOR) 50 MG tablet Take 50 mg by mouth 2 (two) times daily.     omeprazole (PRILOSEC) 20 MG capsule Take 40 mg by mouth daily.      sertraline (ZOLOFT) 100 MG tablet Take 50 mg by mouth daily.     Vitamin D, Cholecalciferol, 1000 UNITS TABS Take 2,000 Units by mouth daily.     insulin glargine (LANTUS) 100 UNIT/ML injection Inject 50 Units into the skin at bedtime. (Patient not taking: Reported on 07/25/2021)     No current facility-administered medications for this visit.   Facility-Administered Medications Ordered in Other  Visits  Medication Dose Route Frequency Provider Last Rate Last Admin   potassium chloride (K-DUR) CR tablet 40 mEq  40 mEq Oral Once [name], MD        Allergies:   Atenolol, Gemfibrozil, Simvastatin, and Lac bovis    Social History:  The patient  reports that he quit smoking about 38 years ago. His smoking use included cigarettes. He has never used smokeless tobacco. He reports that he does not drink alcohol and does not use drugs.   Family History:  The patient's family history includes Diabetes in his mother.    ROS:  Please see the history of present illness.   Otherwise, review of systems are positive for none.   All other systems are reviewed and negative.    PHYSICAL EXAM: VS:  BP 122/72   Pulse 69   Ht 5\' 9"  (1.753 m)   Wt 201 lb (91.2 kg)   SpO2 98%   BMI 29.68 kg/m  , BMI Body mass index is 29.68 kg/m. Affect appropriate Obese black male  HEENT: normal Neck supple with no adenopathy JVP normal no bruits no thyromegaly Lungs clear with no wheezing and good diaphragmatic motion Heart:  S1/S2 no murmur, no rub, gallop or click PMI normal Abdomen: benighn, BS positve, no tenderness, no AAA ventral hernia  no bruit.  No HSM or HJR Distal pulses intact with no bruits No edema Neuro non-focal Skin warm and dry No muscular weakness    EKG:  09/15/19 SR rate 68 non specific ST changes no change from 2017   Recent Labs: No results found for requested labs within last 8760 hours.    Lipid Panel No results found for: CHOL, TRIG, HDL, CHOLHDL, VLDL, LDLCALC, LDLDIRECT    Wt Readings from Last 3 Encounters:  07/25/21 201 lb (91.2 kg)  09/15/19 215 lb (97.5 kg)  04/02/18 220 lb 1.9 oz (99.8 kg)      Other studies Reviewed: Additional studies/ records that were reviewed today include: cath 2012 and 2017.    ASSESSMENT AND PLAN:  1. CAD:  Non obstructive by cath 2017 continue risk factor modification  New nitro needed he gets it free from [place]  Abnormal myovue 06/2021 at [facility] likely soft tissue artifact normal EF Given atypical nature of pain and CRF will defer aggressive f/u Calcium score to risk stratify Check BMET today  2. HTN:  Well controlled.  Continue current medications and low sodium Dash type diet.   3. DM:  Discussed low carb diet.  Target hemoglobin A1c is 6.5 or less.  Continue current medications. 4. Cholesterol:  [facility] 5. Paresthesias:  F/U [facility] CT head and C spine ok    Current medicines are reviewed at length with the patient today.  The patient does not have concerns regarding medicines.  The following changes have been made:  no change  Labs/ tests ordered today include  BMET/Calcium score   No orders of the defined types were placed in this encounter.    Disposition:   FU with cardiology in a year      Signed, Jenkins Rouge, MD  07/25/2021 2:59 PM    [address], [place], [place]  [address] Phone: [phone]; Fax: [phone]

## 2021-07-21 NOTE — Progress Notes (Signed)
?  ?Cardiology Office Note ? ? ?Date:  07/25/2021  ? ?ID:  [name], DOB 02-11-1951, MRN [id number] ? ?PCP:  Toya Smothers, PA  ?Cardiologist:   Charlton Haws, MD  ? ? ?History of Present Illness: ? ?71 y.o. f/u HTN, IDDM and known moderate CAD Last cath May 2017 50% mid LAD disease with negative flow-wire  Followed mostly at the [place].Some neuropathic pain in left face/arm  ? ?Indicates had carotids checked at [facility] and 50% stenosis  ? ?Has a black lab and pittie at home that he walks daily with no chest pain Has not had good control of DM due to dietary indiscretion A1c over 8 ? ?April he had a stress myovue at [place] Read report and most consistent with breast/soft tissue attenuation low risk with normal EF He has had very atypical chest pain sharp lasting seconds and non exertional He Has some CRF I don't know his Cr ? ?Shared decision making given atypical nature of pain and CRF discussed options Do not want to do full CTA/Cath. Start with calcium score and if very high may consider PET/CT or contrast study f/u depending on his Cr We will check labs today  ? ? ?Past Medical History:  ?Diagnosis Date  ? CAD (coronary artery disease)   ? Chest pain   ? HTN (hypertension)   ? IDDM (insulin dependent diabetes mellitus)   ? SOB (shortness of breath)   ? ? ?Past Surgical History:  ?Procedure Laterality Date  ? CARDIAC CATHETERIZATION N/A 08/11/2015  ? Procedure: Left Heart Cath and Coronary Angiography;  Surgeon: Wendall Stade, MD;  Location: [facility] INVASIVE CV LAB;  Service: Cardiovascular;  Laterality: N/A;  ? LEFT HEART CATHETERIZATION WITH CORONARY ANGIOGRAM N/A 02/27/2014  ? Procedure: LEFT HEART CATHETERIZATION WITH CORONARY ANGIOGRAM;  Surgeon: Wendall Stade, MD;  Location: [facility] CATH LAB;  Service: Cardiovascular;  Laterality: N/A;  ? ? ? ?Current Outpatient Medications  ?Medication Sig Dispense Refill  ? amitriptyline (ELAVIL) 25 MG tablet Take 25 mg by mouth at bedtime.      ? amLODipine (NORVASC) 10 MG tablet Take 10 mg by  mouth daily.    ? aspirin EC 81 MG tablet Take 81 mg by mouth daily.    ? atorvastatin (LIPITOR) 20 MG tablet Take 20 mg by mouth daily.    ? cloNIDine (CATAPRES) 0.2 MG tablet Take 0.2 mg by mouth 3 (three) times daily.    ? clotrimazole (LOTRIMIN) 1 % cream Apply 1 application topically daily as needed (for athletes feet).     ? cyanocobalamin 500 MCG tablet Take 1,000 mcg by mouth daily.     ? dicyclomine (BENTYL) 10 MG capsule Take 10 mg by mouth 2 (two) times daily.     ? ferrous sulfate 325 (65 FE) MG tablet Take 325 mg by mouth 2 (two) times daily.    ? gabapentin (NEURONTIN) 300 MG capsule Take 300 mg by mouth 3 (three) times daily.    ? hydrALAZINE (APRESOLINE) 100 MG tablet Take 100 mg by mouth 3 (three) times daily.    ? hydroxypropyl methylcellulose (ISOPTO TEARS) 2.5 % ophthalmic solution Place 1-2 drops into both eyes as needed for dry eyes.     ? ibuprofen (ADVIL,MOTRIN) 400 MG tablet Take 400 mg by mouth every 6 (six) hours as needed for pain.    ? insulin aspart (NOVOLOG) 100 UNIT/ML injection Inject into the skin 2 (two) times daily. Take 12 units in the am and 17 units in  the PM    ? isosorbide mononitrate (IMDUR) 30 MG 24 hr tablet Take 0.5 tablets (15 mg total) by mouth daily. 45 tablet 3  ? lisinopril (PRINIVIL,ZESTRIL) 40 MG tablet Take 40 mg by mouth daily.      ? metFORMIN (GLUMETZA) 1000 MG (MOD) 24 hr tablet Take 500 mg by mouth daily.    ? metoprolol (LOPRESSOR) 50 MG tablet Take 50 mg by mouth 2 (two) times daily.    ? omeprazole (PRILOSEC) 20 MG capsule Take 40 mg by mouth daily.     ? sertraline (ZOLOFT) 100 MG tablet Take 50 mg by mouth daily.    ? Vitamin D, Cholecalciferol, 1000 UNITS TABS Take 2,000 Units by mouth daily.    ? insulin glargine (LANTUS) 100 UNIT/ML injection Inject 50 Units into the skin at bedtime. (Patient not taking: Reported on 07/25/2021)    ? ?No current facility-administered medications for this visit.  ? ?Facility-Administered Medications Ordered in Other  Visits  ?Medication Dose Route Frequency Provider Last Rate Last Admin  ? potassium chloride (K-DUR) CR tablet 40 mEq  40 mEq Oral Once Wendall StadeNishan, Cerise Lieber C, MD      ? ? ?Allergies:   Atenolol, Gemfibrozil, Simvastatin, and Lac bovis  ? ? ?Social History:  The patient  reports that he quit smoking about 38 years ago. His smoking use included cigarettes. He has never used smokeless tobacco. He reports that he does not drink alcohol and does not use drugs.  ? ?Family History:  The patient's family history includes Diabetes in his mother.  ? ? ?ROS:  Please see the history of present illness.   Otherwise, review of systems are positive for none.   All other systems are reviewed and negative.  ? ? ?PHYSICAL EXAM: ?VS:  BP 122/72   Pulse 69   Ht 5\' 9"  (1.753 m)   Wt 201 lb (91.2 kg)   SpO2 98%   BMI 29.68 kg/m?  , BMI Body mass index is 29.68 kg/m?. ?Affect appropriate ?Obese black male  ?HEENT: normal ?Neck supple with no adenopathy ?JVP normal no bruits no thyromegaly ?Lungs clear with no wheezing and good diaphragmatic motion ?Heart:  S1/S2 no murmur, no rub, gallop or click ?PMI normal ?Abdomen: benighn, BS positve, no tenderness, no AAA ventral hernia  ?no bruit.  No HSM or HJR ?Distal pulses intact with no bruits ?No edema ?Neuro non-focal ?Skin warm and dry ?No muscular weakness ? ? ? ?EKG:  09/15/19 SR rate 68 non specific ST changes no change from 2017 ? ? ?Recent Labs: ?No results found for requested labs within last 8760 hours.  ? ? ?Lipid Panel ?No results found for: CHOL, TRIG, HDL, CHOLHDL, VLDL, LDLCALC, LDLDIRECT ?  ? ?Wt Readings from Last 3 Encounters:  ?07/25/21 201 lb (91.2 kg)  ?09/15/19 215 lb (97.5 kg)  ?04/02/18 220 lb 1.9 oz (99.8 kg)  ?  ? ? ?Other studies Reviewed: ?Additional studies/ records that were reviewed today include: cath 2012 and 2017. ? ? ? ?ASSESSMENT AND PLAN: ? ?1. CAD:  Non obstructive by cath 2017 continue risk factor modification  [facility]  [facility] normal EF Given atypical nature of pain and CRF will defer aggressive f/u Calcium score to risk stratify Check BMET today  ?2. HTN:  Well controlled.  Continue current medications and low sodium Dash type diet.   ?3. DM:  Discussed low carb diet.  Target hemoglobin A1c is 6.5 or less.  Continue current medications. ?4. Cholesterol:  [facility] ?5. Paresthesias:  F/U [facility] head and C spine ok  ? ? ?Current medicines are reviewed at length with the patient today.  The patient does not have concerns regarding medicines. ? ?The following changes have been made:  no change ? ?Labs/ tests ordered today include  BMET/Calcium score  ? ?No orders of the defined types were placed in this encounter. ? ? ? ?Disposition:   FU with cardiology in a year   ? ? ? ?Signed, ?Charlton Haws, MD  ?07/25/2021 2:59 PM    ?[facility] ?[address] [place], [place], [place]  [address] ?Phone: [phone]; Fax: [phone]  ?

## 2021-07-25 ENCOUNTER — Encounter: Payer: Self-pay | Admitting: Cardiovascular Disease

## 2021-07-25 ENCOUNTER — Ambulatory Visit: Payer: Medicare PPO | Admitting: Cardiovascular Disease

## 2021-07-25 ENCOUNTER — Ambulatory Visit (INDEPENDENT_AMBULATORY_CARE_PROVIDER_SITE_OTHER)
Admission: RE | Admit: 2021-07-25 | Discharge: 2021-07-25 | Disposition: A | Discharge status: Home / Self Care | Payer: Self-pay | Source: Ambulatory Visit | Attending: Cardiovascular Disease | Admitting: Cardiovascular Disease

## 2021-07-25 VITALS — BP 122/72 | HR 69 | Ht 69.0 in | Wt 201.0 lb

## 2021-07-25 DIAGNOSIS — I2510 Atherosclerotic heart disease of native coronary artery without angina pectoris: Secondary | ICD-10-CM

## 2021-07-25 DIAGNOSIS — R079 Chest pain, unspecified: Secondary | ICD-10-CM | POA: Diagnosis not present

## 2021-07-25 DIAGNOSIS — N183 Chronic kidney disease, stage 3 unspecified: Secondary | ICD-10-CM

## 2021-07-25 DIAGNOSIS — I10 Essential (primary) hypertension: Secondary | ICD-10-CM

## 2021-07-25 NOTE — Patient Instructions (Signed)
Medication Instructions:  ?Your physician recommends that you continue on your current medications as directed. Please refer to the Current Medication list given to you today. ? ?*If you need a refill on your cardiac medications before your next appointment, please call your pharmacy* ? ?Lab Work: ?Your physician recommends that you have lab work today- BMET and CBC ? ?If you have labs (blood work) drawn today and your tests are completely normal, you will receive your results only by: ?MyChart Message (if you have MyChart) OR ?A paper copy in the mail ?If you have any lab test that is abnormal or we need to change your treatment, we will call you to review the results. ? ?Testing/Procedures: ?Cardiac CT scanning for calcium score, (CAT scanning), is a noninvasive, special x-ray that produces cross-sectional images of the body using x-rays and a computer. CT scans help physicians diagnose and treat medical conditions. For some CT exams, a contrast material is used to enhance visibility in the area of the body being studied. CT scans provide greater clarity and reveal more details than regular x-ray exams. ? ?Follow-Up: ?At [facility], you and your health needs are our priority.  As part of our continuing mission to provide you with exceptional heart care, we have created designated Provider Care Teams.  These Care Teams include your primary Cardiologist (physician) and Advanced Practice Providers (APPs -  Physician Assistants and Nurse Practitioners) who all work together to provide you with the care you need, when you need it. ? ?We recommend signing up for the patient portal called "MyChart".  Sign up information is provided on this After Visit Summary.  MyChart is used to connect with patients for Virtual Visits (Telemedicine).  Patients are able to view lab/test results, encounter notes, upcoming appointments, etc.  Non-urgent messages can be sent to your provider as well.   ?To learn more about what you can  do with MyChart, go to [email or web address].   ? ?Your next appointment:   ?6 month(s) ? ?The format for your next appointment:   ?In Person ? ?Provider:   ?Charlton Haws, MD { ? ? ?Important Information About Sugar ? ? ? ? ?  ?

## 2021-07-26 LAB — CBC WITH DIFFERENTIAL/PLATELET
Basophils Absolute: 0 10*3/uL (ref 0.0–0.2)
Basos: 1 %
EOS (ABSOLUTE): 0.2 10*3/uL (ref 0.0–0.4)
Eos: 2 %
Hematocrit: 37.3 % — ABNORMAL LOW (ref 37.5–51.0)
Hemoglobin: 12.1 g/dL — ABNORMAL LOW (ref 13.0–17.7)
Immature Grans (Abs): 0 10*3/uL (ref 0.0–0.1)
Immature Granulocytes: 0 %
Lymphocytes Absolute: 1.7 10*3/uL (ref 0.7–3.1)
Lymphs: 26 %
MCH: 25.4 pg — ABNORMAL LOW (ref 26.6–33.0)
MCHC: 32.4 g/dL (ref 31.5–35.7)
MCV: 78 fL — ABNORMAL LOW (ref 79–97)
Monocytes Absolute: 0.5 10*3/uL (ref 0.1–0.9)
Monocytes: 7 %
Neutrophils Absolute: 4.1 10*3/uL (ref 1.4–7.0)
Neutrophils: 64 %
Platelets: 266 10*3/uL (ref 150–450)
RBC: 4.77 x10E6/uL (ref 4.14–5.80)
RDW: 15 % (ref 11.6–15.4)
WBC: 6.4 10*3/uL (ref 3.4–10.8)

## 2021-07-26 LAB — BASIC METABOLIC PANEL
BUN/Creatinine Ratio: 15 (ref 10–24)
BUN: 19 mg/dL (ref 8–27)
CO2: 25 mmol/L (ref 20–29)
Calcium: 9.9 mg/dL (ref 8.6–10.2)
Chloride: 103 mmol/L (ref 96–106)
Creatinine, Ser: 1.24 mg/dL (ref 0.76–1.27)
Glucose: 197 mg/dL — ABNORMAL HIGH (ref 70–99)
Potassium: 3.7 mmol/L (ref 3.5–5.2)
Sodium: 142 mmol/L (ref 134–144)
eGFR: 61 mL/min/{1.73_m2} (ref >59)

## 2021-07-27 ENCOUNTER — Telehealth: Payer: Self-pay

## 2021-07-27 DIAGNOSIS — R072 Precordial pain: Secondary | ICD-10-CM

## 2021-07-27 DIAGNOSIS — R079 Chest pain, unspecified: Secondary | ICD-10-CM

## 2021-07-27 DIAGNOSIS — I10 Essential (primary) hypertension: Secondary | ICD-10-CM

## 2021-07-27 DIAGNOSIS — I2510 Atherosclerotic heart disease of native coronary artery without angina pectoris: Secondary | ICD-10-CM

## 2021-07-27 NOTE — Telephone Encounter (Signed)
-----   Message from Wendall Stade, MD sent at 07/26/2021  9:30 AM EDT ----- ?Calcium score very high 1871 90 th percentile Needs updated lipids  ?Order Cardiac PET CT scan on lipitor dose may need to be repeated if LDL ?> 70  ?

## 2021-07-27 NOTE — Telephone Encounter (Signed)
Called patient about his results. Will place order for testing. ?

## 2021-07-28 ENCOUNTER — Telehealth: Payer: Self-pay | Admitting: Cardiovascular Disease

## 2021-07-28 NOTE — Telephone Encounter (Signed)
Per  Dr. Earlene Plater, Calcium score very high 1871 90 th percentile Needs updated lipids Order Cardiac PET CT scan on lipitor dose may need to be repeated if LDL > 70. Informed patient again of results and plan. Patient verbalized understanding.  ?

## 2021-07-28 NOTE — Telephone Encounter (Signed)
Patient call to go over his results. Please advise  ?

## 2021-08-01 ENCOUNTER — Other Ambulatory Visit: Payer: Medicare PPO | Admitting: *Deleted

## 2021-08-01 DIAGNOSIS — I2510 Atherosclerotic heart disease of native coronary artery without angina pectoris: Secondary | ICD-10-CM

## 2021-08-01 DIAGNOSIS — R079 Chest pain, unspecified: Secondary | ICD-10-CM

## 2021-08-01 DIAGNOSIS — I10 Essential (primary) hypertension: Secondary | ICD-10-CM

## 2021-08-01 LAB — HEPATIC FUNCTION PANEL
ALT: 14 IU/L (ref 0–44)
AST: 16 IU/L (ref 0–40)
Albumin: 4.3 g/dL (ref 3.7–4.7)
Alkaline Phosphatase: 150 IU/L — ABNORMAL HIGH (ref 44–121)
Bilirubin Total: 0.3 mg/dL (ref 0.0–1.2)
Bilirubin, Direct: <0.1 mg/dL (ref 0.00–0.40)
Total Protein: 6.9 g/dL (ref 6.0–8.5)

## 2021-08-01 LAB — LIPID PANEL
Chol/HDL Ratio: 2.3 ratio (ref 0.0–5.0)
Cholesterol, Total: 132 mg/dL (ref 100–199)
HDL: 58 mg/dL (ref >39)
LDL Chol Calc (NIH): 56 mg/dL (ref 0–99)
Triglycerides: 97 mg/dL (ref 0–149)
VLDL Cholesterol Cal: 18 mg/dL (ref 5–40)

## 2021-08-12 ENCOUNTER — Telehealth: Payer: Self-pay | Admitting: Cardiovascular Disease

## 2021-08-12 DIAGNOSIS — I2510 Atherosclerotic heart disease of native coronary artery without angina pectoris: Secondary | ICD-10-CM

## 2021-08-12 DIAGNOSIS — R079 Chest pain, unspecified: Secondary | ICD-10-CM

## 2021-08-12 DIAGNOSIS — R931 Abnormal findings on diagnostic imaging of heart and coronary circulation: Secondary | ICD-10-CM

## 2021-08-12 NOTE — Telephone Encounter (Signed)
Patient is requesting to schedule a heart cath, as previously discussed with Dr. Eden Emms.

## 2021-08-12 NOTE — Telephone Encounter (Signed)
Spoke with pt and advised per Dr Eden Emms ok to proceed with heart cath.  Pt advised will forward to Dr Fabio Bering nurse to arrange scheduling.  Pt advised he may need an OV prior to scheduling heart cath.  Pt verbalizes understanding and agrees with current plan.

## 2021-08-12 NOTE — Telephone Encounter (Signed)
From Dr Fabio Bering 07/25/2021 OV note: Shared decision making given atypical nature of pain and CRF discussed options Do not want to do full CTA/Cath. Start with calcium score and if very high may consider PET/CT or contrast study f/u depending on his Cr We will check labs today    Spoke with pt who reports he would like to go ahead and be scheduled for a heart cath despite having been ordered to have PET scan.  Pt states "I may as well go ahead and do it if that's what's gonna have to be done."  Advised I will have Dr Eden Emms to review for orders. Pt aware he will be contacted with further instruction/information.

## 2021-08-15 ENCOUNTER — Other Ambulatory Visit: Payer: Self-pay | Admitting: Cardiovascular Disease

## 2021-08-15 DIAGNOSIS — R079 Chest pain, unspecified: Secondary | ICD-10-CM

## 2021-08-15 MED ORDER — SODIUM CHLORIDE 0.9% FLUSH: 3.0000 mL | Freq: Two times a day (BID) | INTRAVENOUS | Status: AC

## 2021-08-15 NOTE — Telephone Encounter (Signed)
Called patient with date and time of Heart Cath. Patient will have procedure done this Thursday with Dr. [place]. Will send letter of instructions to patient's e-mail address listed on chart and to his mychart as well.

## 2021-08-15 NOTE — Telephone Encounter (Signed)
Called patient to see when he wants to schedule heart cath. Patient stated he would have to call back, that he needs to check with his wife's schedule.

## 2021-08-15 NOTE — Telephone Encounter (Signed)
Patient is following up regarding scheduling heart cath. He states this Thursday would be fine if possible.

## 2021-08-17 ENCOUNTER — Telehealth: Payer: Self-pay | Admitting: *Deleted

## 2021-08-17 LAB — CBC WITH DIFFERENTIAL/PLATELET
Basophils Absolute: 0 10*3/uL (ref 0.0–0.2)
Basos: 0 %
EOS (ABSOLUTE): 0.1 10*3/uL (ref 0.0–0.4)
Eos: 2 %
Hematocrit: 39.9 % (ref 37.5–51.0)
Hemoglobin: 12.7 g/dL — ABNORMAL LOW (ref 13.0–17.7)
Immature Grans (Abs): 0 10*3/uL (ref 0.0–0.1)
Immature Granulocytes: 0 %
Lymphocytes Absolute: 1.9 10*3/uL (ref 0.7–3.1)
Lymphs: 25 %
MCH: 25.6 pg — ABNORMAL LOW (ref 26.6–33.0)
MCHC: 31.8 g/dL (ref 31.5–35.7)
MCV: 80 fL (ref 79–97)
Monocytes Absolute: 0.4 10*3/uL (ref 0.1–0.9)
Monocytes: 6 %
Neutrophils Absolute: 5 10*3/uL (ref 1.4–7.0)
Neutrophils: 67 %
Platelets: 273 10*3/uL (ref 150–450)
RBC: 4.96 x10E6/uL (ref 4.14–5.80)
RDW: 14.8 % (ref 11.6–15.4)
WBC: 7.5 10*3/uL (ref 3.4–10.8)

## 2021-08-17 LAB — BASIC METABOLIC PANEL
BUN/Creatinine Ratio: 17 (ref 10–24)
BUN: 19 mg/dL (ref 8–27)
CO2: 25 mmol/L (ref 20–29)
Calcium: 10 mg/dL (ref 8.6–10.2)
Chloride: 102 mmol/L (ref 96–106)
Creatinine, Ser: 1.15 mg/dL (ref 0.76–1.27)
Glucose: 143 mg/dL — ABNORMAL HIGH (ref 70–99)
Potassium: 3.4 mmol/L — ABNORMAL LOW (ref 3.5–5.2)
Sodium: 142 mmol/L (ref 134–144)
eGFR: 67 mL/min/{1.73_m2} (ref >59)

## 2021-08-17 MED ORDER — POTASSIUM CHLORIDE CRYS ER 20 MEQ PO TBCR: EXTENDED_RELEASE_TABLET | ORAL | 0 refills | Status: AC

## 2021-08-17 NOTE — Telephone Encounter (Signed)
See 08/16/21 BMP results-K 3.4  Copied from 08/17/21 staff message Dr Eden Emms: "Hold hygroton and call in kdur 20 mgeq and have him take two doses will likely need post cath on diuretic "   "2 doses today then just one dose 20 meq daily post cath if they resume his hygroton"  Reviewed above instructions and plan with patient: -hold Hygroton -take Kdur 20 mEq two doses today (#2 tablets [facility] [place] [place] -pt advised he will be given instructions about resuming hygroton and continuing potassium post cath.

## 2021-08-17 NOTE — Telephone Encounter (Signed)
Cardiac Catheterization scheduled at [facility] for: Thursday Aug 18, 2021 12 Noon Arrival time and place: [facility] Main Entrance A at: 10 AM   Nothing to eat after midnight prior to procedure, clear liquids until 5 AM day of procedure.  Medication instructions: -Hold:  Insulin-AM of procedure-pt reports does not take Insulin HS  Metformin-day of procedure and 48 hours post procedure  Chlorthalidone-AM of procedure -Except hold medications usual morning medications can be taken with sips of water including aspirin 81 mg.  Confirmed patient has responsible adult to drive home post procedure and be with patient first 24 hours after arriving home.  Patient reports no new symptoms concerning for COVID-19/no exposure to COVID-19 in the past 10 days.  Reviewed procedure instructions with patient.

## 2021-08-17 NOTE — Addendum Note (Signed)
Addended by: Jacqlyn Krauss on: 08/17/2021 11:52 AM   Modules accepted: Orders

## 2021-08-18 ENCOUNTER — Other Ambulatory Visit: Payer: Self-pay

## 2021-08-18 ENCOUNTER — Encounter (HOSPITAL_COMMUNITY)
Admission: RE | Disposition: A | Discharge status: Home / Self Care | Payer: Self-pay | Source: Home / Self Care | Attending: Cardiology

## 2021-08-18 ENCOUNTER — Ambulatory Visit (HOSPITAL_COMMUNITY)
Admission: RE | Admit: 2021-08-18 | Discharge: 2021-08-18 | Disposition: A | Discharge status: Home / Self Care | Payer: Medicare PPO | Attending: Cardiology | Admitting: Cardiology

## 2021-08-18 DIAGNOSIS — R202 Paresthesia of skin: Secondary | ICD-10-CM | POA: Insufficient documentation

## 2021-08-18 DIAGNOSIS — I2510 Atherosclerotic heart disease of native coronary artery without angina pectoris: Secondary | ICD-10-CM

## 2021-08-18 DIAGNOSIS — E1140 Type 2 diabetes mellitus with diabetic neuropathy, unspecified: Secondary | ICD-10-CM | POA: Diagnosis not present

## 2021-08-18 DIAGNOSIS — E1122 Type 2 diabetes mellitus with diabetic chronic kidney disease: Secondary | ICD-10-CM | POA: Insufficient documentation

## 2021-08-18 DIAGNOSIS — Z794 Long term (current) use of insulin: Secondary | ICD-10-CM | POA: Insufficient documentation

## 2021-08-18 DIAGNOSIS — N189 Chronic kidney disease, unspecified: Secondary | ICD-10-CM | POA: Insufficient documentation

## 2021-08-18 DIAGNOSIS — R079 Chest pain, unspecified: Secondary | ICD-10-CM

## 2021-08-18 DIAGNOSIS — Z7984 Long term (current) use of oral hypoglycemic drugs: Secondary | ICD-10-CM | POA: Diagnosis not present

## 2021-08-18 DIAGNOSIS — I129 Hypertensive chronic kidney disease with stage 1 through stage 4 chronic kidney disease, or unspecified chronic kidney disease: Secondary | ICD-10-CM | POA: Diagnosis not present

## 2021-08-18 DIAGNOSIS — Z87891 Personal history of nicotine dependence: Secondary | ICD-10-CM | POA: Insufficient documentation

## 2021-08-18 HISTORY — PX: LEFT HEART CATH AND CORONARY ANGIOGRAPHY: CATH118249

## 2021-08-18 LAB — GLUCOSE, CAPILLARY: Glucose-Capillary: 160 mg/dL — ABNORMAL HIGH (ref 70–99)

## 2021-08-18 LAB — BASIC METABOLIC PANEL
Anion gap: 8 (ref 5–15)
BUN: 17 mg/dL (ref 8–23)
CO2: 27 mmol/L (ref 22–32)
Calcium: 9.6 mg/dL (ref 8.9–10.3)
Chloride: 107 mmol/L (ref 98–111)
Creatinine, Ser: 1.43 mg/dL — ABNORMAL HIGH (ref 0.61–1.24)
GFR, Estimated: 52 mL/min — ABNORMAL LOW (ref >60)
Glucose, Bld: 161 mg/dL — ABNORMAL HIGH (ref 70–99)
Potassium: 3.7 mmol/L (ref 3.5–5.1)
Sodium: 142 mmol/L (ref 135–145)

## 2021-08-18 SURGERY — LEFT HEART CATH AND CORONARY ANGIOGRAPHY
Anesthesia: LOCAL

## 2021-08-18 MED ORDER — SODIUM CHLORIDE 0.9 % IV SOLN: 250.0000 mL | INTRAVENOUS | Status: DC | PRN

## 2021-08-18 MED ORDER — HEPARIN (PORCINE) IN NACL 1000-0.9 UT/500ML-% IV SOLN
INTRAVENOUS | Status: DC | PRN
  Administered 2021-08-18 (×2): 500 mL

## 2021-08-18 MED ORDER — HEPARIN (PORCINE) IN NACL 1000-0.9 UT/500ML-% IV SOLN
INTRAVENOUS | Status: AC
  Filled 2021-08-18: qty 500

## 2021-08-18 MED ORDER — SODIUM CHLORIDE 0.9 % WEIGHT BASED INFUSION: 1.0000 mL/kg/h | INTRAVENOUS | Status: DC

## 2021-08-18 MED ORDER — FENTANYL CITRATE (PF) 100 MCG/2ML IJ SOLN
INTRAMUSCULAR | Status: DC | PRN
  Administered 2021-08-18: 25 ug via INTRAVENOUS

## 2021-08-18 MED ORDER — FENTANYL CITRATE (PF) 100 MCG/2ML IJ SOLN
INTRAMUSCULAR | Status: AC
  Filled 2021-08-18: qty 2

## 2021-08-18 MED ORDER — ASPIRIN 81 MG PO CHEW: 81.0000 mg | CHEWABLE_TABLET | ORAL | Status: DC

## 2021-08-18 MED ORDER — VERAPAMIL HCL 2.5 MG/ML IV SOLN
INTRAVENOUS | Status: DC | PRN
  Administered 2021-08-18: 10 mL via INTRA_ARTERIAL

## 2021-08-18 MED ORDER — SODIUM CHLORIDE 0.9% FLUSH: 3.0000 mL | Freq: Two times a day (BID) | INTRAVENOUS | Status: DC

## 2021-08-18 MED ORDER — SODIUM CHLORIDE 0.9% FLUSH: 3.0000 mL | INTRAVENOUS | Status: DC | PRN

## 2021-08-18 MED ORDER — ACETAMINOPHEN 325 MG PO TABS: 650.0000 mg | ORAL_TABLET | ORAL | Status: DC | PRN

## 2021-08-18 MED ORDER — METFORMIN HCL ER 500 MG PO TB24: 500.0000 mg | ORAL_TABLET | Freq: Every evening | ORAL | Status: AC

## 2021-08-18 MED ORDER — HEPARIN SODIUM (PORCINE) 1000 UNIT/ML IJ SOLN
INTRAMUSCULAR | Status: AC
  Filled 2021-08-18: qty 10

## 2021-08-18 MED ORDER — SODIUM CHLORIDE 0.9 % WEIGHT BASED INFUSION
3.0000 mL/kg/h | INTRAVENOUS | Status: AC
  Administered 2021-08-18: 3 mL/kg/h via INTRAVENOUS

## 2021-08-18 MED ORDER — HEPARIN SODIUM (PORCINE) 1000 UNIT/ML IJ SOLN
INTRAMUSCULAR | Status: DC | PRN
  Administered 2021-08-18: 4500 [IU] via INTRAVENOUS

## 2021-08-18 MED ORDER — LIDOCAINE HCL (PF) 1 % IJ SOLN
INTRAMUSCULAR | Status: DC | PRN
  Administered 2021-08-18: 2 mL

## 2021-08-18 MED ORDER — VERAPAMIL HCL 2.5 MG/ML IV SOLN
INTRAVENOUS | Status: AC
  Filled 2021-08-18: qty 2

## 2021-08-18 MED ORDER — ONDANSETRON HCL 4 MG/2ML IJ SOLN: 4.0000 mg | Freq: Four times a day (QID) | INTRAMUSCULAR | Status: DC | PRN

## 2021-08-18 MED ORDER — MIDAZOLAM HCL 2 MG/2ML IJ SOLN
INTRAMUSCULAR | Status: DC | PRN
  Administered 2021-08-18: 2 mg via INTRAVENOUS

## 2021-08-18 MED ORDER — IOHEXOL 350 MG/ML SOLN
INTRAVENOUS | Status: DC | PRN
  Administered 2021-08-18: 45 mL

## 2021-08-18 MED ORDER — MIDAZOLAM HCL 2 MG/2ML IJ SOLN
INTRAMUSCULAR | Status: AC
Start: 2021-08-18 — End: ?
  Filled 2021-08-18: qty 2

## 2021-08-18 SURGICAL SUPPLY — 10 items
BAND CMPR LRG ZPHR (HEMOSTASIS) ×1
BAND ZEPHYR COMPRESS 30 LONG (HEMOSTASIS) ×1 IMPLANT
CATH 5FR JL3.5 JR4 ANG PIG MP (CATHETERS) ×1 IMPLANT
GLIDESHEATH SLEND SS 6F .021 (SHEATH) ×1 IMPLANT
GUIDEWIRE INQWIRE 1.5J.035X260 (WIRE) IMPLANT
INQWIRE 1.5J .035X260CM (WIRE) ×2
KIT HEART LEFT (KITS) ×2 IMPLANT
PACK CARDIAC CATHETERIZATION (CUSTOM PROCEDURE TRAY) ×2 IMPLANT
TRANSDUCER W/STOPCOCK (MISCELLANEOUS) ×2 IMPLANT
TUBING CIL FLEX 10 FLL-RA (TUBING) ×2 IMPLANT

## 2021-08-18 NOTE — Interval H&P Note (Signed)
History and Physical Interval Note:  08/18/2021 11:05 AM  [name]  has presented today for surgery, with the diagnosis of elevated calium score - chest pain.  The various methods of treatment have been discussed with the patient and family. After consideration of risks, benefits and other options for treatment, the patient has consented to  Procedure(s): LEFT HEART CATH AND CORONARY ANGIOGRAPHY (N/A) as a surgical intervention.  The patient's history has been reviewed, patient examined, no change in status, stable for surgery.  I have reviewed the patient's chart and labs.  Questions were answered to the patient's satisfaction.   Cath Lab Visit (complete for each Cath Lab visit)  Clinical Evaluation Leading to the Procedure:   ACS: No.  Non-ACS:    Anginal Classification: CCS II  Anti-ischemic medical therapy: Maximal Therapy (2 or more classes of medications)  Non-Invasive Test Results: No non-invasive testing performed  Prior CABG: No previous CABG        [name] [facility] 08/18/2021 11:05 AM

## 2021-08-19 ENCOUNTER — Encounter (HOSPITAL_COMMUNITY): Payer: Self-pay | Admitting: Cardiology
